# Patient Record
Sex: Male | Born: 1965 | Race: White | Hispanic: No | Marital: Married | Smoking: Never smoker
Health system: Southern US, Community
[De-identification: ages and names within clinical notes are randomized; demographics above are authoritative.]

## PROBLEM LIST (undated history)

## (undated) DIAGNOSIS — N200 Calculus of kidney: Secondary | ICD-10-CM

## (undated) DIAGNOSIS — I10 Essential (primary) hypertension: Secondary | ICD-10-CM

## (undated) HISTORY — PX: FINGER SURGERY: SHX640

---

## 2004-12-18 ENCOUNTER — Emergency Department: Payer: Self-pay | Admitting: Emergency Medicine

## 2005-09-17 ENCOUNTER — Emergency Department: Payer: Self-pay | Admitting: Emergency Medicine

## 2005-09-19 ENCOUNTER — Emergency Department: Payer: Self-pay | Admitting: Emergency Medicine

## 2005-09-21 ENCOUNTER — Emergency Department: Payer: Self-pay | Admitting: Unknown Physician Specialty

## 2005-09-23 ENCOUNTER — Emergency Department: Payer: Self-pay | Admitting: General Practice

## 2006-08-10 ENCOUNTER — Emergency Department: Payer: Self-pay | Admitting: Emergency Medicine

## 2009-04-22 ENCOUNTER — Emergency Department: Payer: Self-pay | Admitting: Emergency Medicine

## 2010-12-07 ENCOUNTER — Emergency Department: Payer: Self-pay | Admitting: Emergency Medicine

## 2011-03-07 ENCOUNTER — Emergency Department: Payer: Self-pay | Admitting: *Deleted

## 2011-04-30 ENCOUNTER — Emergency Department: Payer: Self-pay | Admitting: Unknown Physician Specialty

## 2011-05-02 ENCOUNTER — Emergency Department: Payer: Self-pay | Admitting: Emergency Medicine

## 2011-11-09 ENCOUNTER — Emergency Department: Payer: Self-pay | Admitting: Emergency Medicine

## 2011-11-09 LAB — CBC
HGB: 16 g/dL (ref 13.0–18.0)
MCH: 31.3 pg (ref 26.0–34.0)
MCHC: 33.3 g/dL (ref 32.0–36.0)
Platelet: 270 10*3/uL (ref 150–440)
WBC: 17.1 10*3/uL — ABNORMAL HIGH (ref 3.8–10.6)

## 2011-11-09 LAB — URINALYSIS, COMPLETE
Blood: NEGATIVE
Nitrite: NEGATIVE
Ph: 6 (ref 4.5–8.0)
Protein: 30
RBC,UR: 1 /HPF (ref 0–5)
Specific Gravity: 1.025 (ref 1.003–1.030)
Squamous Epithelial: 1

## 2011-11-09 LAB — BASIC METABOLIC PANEL
Anion Gap: 9 (ref 7–16)
Calcium, Total: 9.1 mg/dL (ref 8.5–10.1)
Chloride: 102 mmol/L (ref 98–107)
Co2: 27 mmol/L (ref 21–32)
Glucose: 136 mg/dL — ABNORMAL HIGH (ref 65–99)
Osmolality: 279 (ref 275–301)
Potassium: 3.4 mmol/L — ABNORMAL LOW (ref 3.5–5.1)

## 2011-11-11 LAB — URINE CULTURE

## 2012-02-03 ENCOUNTER — Emergency Department: Payer: Self-pay | Admitting: Emergency Medicine

## 2012-06-19 ENCOUNTER — Inpatient Hospital Stay: Payer: Self-pay | Admitting: Urology

## 2012-06-19 ENCOUNTER — Ambulatory Visit: Payer: Self-pay

## 2012-06-19 LAB — CBC WITH DIFFERENTIAL/PLATELET
Eosinophil #: 0 10*3/uL (ref 0.0–0.7)
Eosinophil %: 0.1 %
HCT: 42.2 % (ref 40.0–52.0)
HGB: 14.7 g/dL (ref 13.0–18.0)
Lymphocyte #: 1.2 10*3/uL (ref 1.0–3.6)
Lymphocyte %: 8 %
MCHC: 34.9 g/dL (ref 32.0–36.0)
Monocyte #: 0.8 x10 3/mm (ref 0.2–1.0)
Monocyte %: 4.9 %
Platelet: 232 10*3/uL (ref 150–440)
RBC: 4.56 10*6/uL (ref 4.40–5.90)
RDW: 13.2 % (ref 11.5–14.5)

## 2012-06-19 LAB — CBC
HCT: 45.4 % (ref 40.0–52.0)
HGB: 15 g/dL (ref 13.0–18.0)
MCH: 30.9 pg (ref 26.0–34.0)
MCV: 94 fL (ref 80–100)
Platelet: 272 10*3/uL (ref 150–440)
RBC: 4.84 10*6/uL (ref 4.40–5.90)
RDW: 13.2 % (ref 11.5–14.5)
WBC: 22.3 10*3/uL — ABNORMAL HIGH (ref 3.8–10.6)

## 2012-06-19 LAB — URINALYSIS, COMPLETE
Bacteria: NONE SEEN
Bilirubin,UR: NEGATIVE
RBC,UR: 48 /HPF (ref 0–5)
Specific Gravity: 1.031 (ref 1.003–1.030)
Squamous Epithelial: NONE SEEN
WBC UR: 11 /HPF (ref 0–5)

## 2012-06-19 LAB — BASIC METABOLIC PANEL
Anion Gap: 10 (ref 7–16)
BUN: 18 mg/dL (ref 7–18)
Calcium, Total: 8.7 mg/dL (ref 8.5–10.1)
Chloride: 107 mmol/L (ref 98–107)
Creatinine: 1.18 mg/dL (ref 0.60–1.30)
EGFR (African American): >60
Sodium: 139 mmol/L (ref 136–145)

## 2012-06-20 LAB — CBC WITH DIFFERENTIAL/PLATELET
Basophil %: 0.3 %
HCT: 39.8 % — ABNORMAL LOW (ref 40.0–52.0)
HGB: 13.5 g/dL (ref 13.0–18.0)
Lymphocyte #: 2.8 10*3/uL (ref 1.0–3.6)
MCH: 32.1 pg (ref 26.0–34.0)
Monocyte %: 11.1 %
Neutrophil #: 11 10*3/uL — ABNORMAL HIGH (ref 1.4–6.5)
Platelet: 206 10*3/uL (ref 150–440)

## 2012-06-20 LAB — BASIC METABOLIC PANEL
Anion Gap: 6 — ABNORMAL LOW (ref 7–16)
BUN: 18 mg/dL (ref 7–18)
Calcium, Total: 8.2 mg/dL — ABNORMAL LOW (ref 8.5–10.1)
Chloride: 108 mmol/L — ABNORMAL HIGH (ref 98–107)
Co2: 27 mmol/L (ref 21–32)
EGFR (African American): >60
EGFR (Non-African Amer.): 57 — ABNORMAL LOW
Sodium: 141 mmol/L (ref 136–145)

## 2012-06-21 LAB — BASIC METABOLIC PANEL
Anion Gap: 7 (ref 7–16)
Chloride: 108 mmol/L — ABNORMAL HIGH (ref 98–107)
Co2: 26 mmol/L (ref 21–32)
EGFR (African American): >60
Glucose: 89 mg/dL (ref 65–99)
Potassium: 3.9 mmol/L (ref 3.5–5.1)
Sodium: 141 mmol/L (ref 136–145)

## 2012-06-21 LAB — CBC WITH DIFFERENTIAL/PLATELET
Basophil #: 0 10*3/uL (ref 0.0–0.1)
Eosinophil #: 0.1 10*3/uL (ref 0.0–0.7)
HGB: 13.5 g/dL (ref 13.0–18.0)
Lymphocyte #: 3.3 10*3/uL (ref 1.0–3.6)
MCH: 32.3 pg (ref 26.0–34.0)
MCHC: 34.2 g/dL (ref 32.0–36.0)
MCV: 94 fL (ref 80–100)
Monocyte %: 9.9 %
Neutrophil #: 5.7 10*3/uL (ref 1.4–6.5)
Neutrophil %: 56.7 %

## 2012-06-21 LAB — PROTIME-INR: Prothrombin Time: 14 secs (ref 11.5–14.7)

## 2013-01-11 ENCOUNTER — Emergency Department: Payer: Self-pay | Admitting: Emergency Medicine

## 2013-01-11 LAB — CBC
HCT: 45.3 % (ref 40.0–52.0)
HGB: 15.6 g/dL (ref 13.0–18.0)
MCH: 31.7 pg (ref 26.0–34.0)
MCHC: 34.5 g/dL (ref 32.0–36.0)
MCV: 92 fL (ref 80–100)
Platelet: 281 10*3/uL (ref 150–440)
RBC: 4.93 10*6/uL (ref 4.40–5.90)
RDW: 13.2 % (ref 11.5–14.5)
WBC: 11.1 10*3/uL — ABNORMAL HIGH (ref 3.8–10.6)

## 2013-01-11 LAB — BASIC METABOLIC PANEL WITH GFR
Anion Gap: 7 (ref 7–16)
BUN: 15 mg/dL (ref 7–18)
Calcium, Total: 9.2 mg/dL (ref 8.5–10.1)
Chloride: 106 mmol/L (ref 98–107)
Co2: 27 mmol/L (ref 21–32)
Creatinine: 1.11 mg/dL (ref 0.60–1.30)
EGFR (African American): >60
EGFR (Non-African Amer.): >60
Glucose: 101 mg/dL — ABNORMAL HIGH (ref 65–99)
Osmolality: 280 (ref 275–301)
Potassium: 3.5 mmol/L (ref 3.5–5.1)
Sodium: 140 mmol/L (ref 136–145)

## 2013-01-11 LAB — URINALYSIS, COMPLETE
Bilirubin,UR: NEGATIVE
Glucose,UR: NEGATIVE mg/dL (ref 0–75)
Ketone: NEGATIVE
Leukocyte Esterase: NEGATIVE
Nitrite: NEGATIVE
Ph: 6 (ref 4.5–8.0)
RBC,UR: 17 /HPF (ref 0–5)
Specific Gravity: 1.018 (ref 1.003–1.030)
Squamous Epithelial: <1

## 2013-04-08 ENCOUNTER — Emergency Department: Payer: Self-pay | Admitting: Internal Medicine

## 2014-09-14 NOTE — H&P (Signed)
PATIENT NAME:  Matthew Bartlett, Matthew Bartlett MR#:  [id number] DATE OF BIRTH:  [birth year]  DATE OF ADMISSION:  06/19/2012  PRIMARY CARE PHYSICIAN:  None.   REFERRING PHYSICIAN:  Chiquita Loth, MD  CHIEF COMPLAINT: Left colic pain.   HISTORY OF PRESENT ILLNESS: This is a 49 year old male with significant past medical history of kidney stones that resolved without any intervention in 2012 without any significant past medical history besides that.  He presents with complaints of left renal colic. The patient reports over the last few hours he started to have left-sided renal colic, as well as was  accompanied by nausea and vomiting, multiple episodes. Denies any coffee-ground emesis, any chest pain, any shortness of breath, any bright red blood per rectum. The patient reports this pain resembles previous colic pain he had last year. In the ED the patient had CT abdomen without contrast which did show evidence of 5 mm kidney stones in the distal ureter causing left kidney hydronephrosis. The patient had multiple Dilaudid dosing with meperidine and Zofran and Demerol and Phenergan without resolution of his nausea and vomiting and with partial relief of his pain, so hospitalist service was requested to admit the patient for further management and control of the patient's symptoms.   PAST MEDICAL HISTORY:  History of kidney stones last year.   PAST SURGICAL HISTORY: None.   ALLERGIES:  PENICILLIN.  HOME MEDICATIONS: None.   SOCIAL HISTORY: The patient denies any history of smoking, alcohol or illicit or IV drug abuse, only reports smoking marijuana occasionally.   FAMILY HISTORY: He denies any family history of diabetes, coronary artery disease or kidney stones.   REVIEW OF SYSTEMS: CONSTITUTIONAL: Denies any fever, chills. Complains of weakness and fatigue.  EYES: Denies blurry vision, double vision, inflammation or glaucoma.  ENT: Denies tinnitus, ear pain, hearing loss, epistaxis or discharge.  RESPIRATORY:  Denies cough, wheezing, hemoptysis, COPD or dyspnea.  CARDIOVASCULAR: Denies chest pain, orthopnea, edema, palpitations, syncope.  GASTROINTESTINAL: Has complaints of nausea, vomiting, left flank pain. Denies any hematemesis, melena, jaundice, rectal bleed.  GENITOURINARY: Denies dysuria, hematuria, has complaints of left-sided renal colic for few hours.  ENDOCRINE:  Denies polyuria, polydipsia, heat or cold intolerance.  HEMATOLOGY: Denies anemia, easy bruising, bleeding diathesis.  INTEGUMENTARY: Denies acne, rash or skin lesions.  MUSCULOSKELETAL: Denies any neck pain, shoulder pain, knee pain, arthritis, gout or cramps.  NEUROLOGIC: Denies any CVA, TIA, seizures, dysarthria, epilepsy, tremors or vertigo.  PSYCHIATRIC: Denies anxiety, insomnia, nervousness, bipolar disorder.   PHYSICAL EXAMINATION:  VITAL SIGNS: Temperature 98.2, pulse 45, respiratory rate 18, blood pressure 185/89 and saturating 99% on room air.  GENERAL: Morbidly obese male, who is in bed in no apparent distress.  HEENT: Head normocephalic. Pupils equal, reactive to light. Pink conjunctivae. Anicteric sclerae. Moist oral mucosa.  NECK: Supple. No thyromegaly. No JVD.  CHEST: Good air entry bilaterally. No wheezing, rales, rhonchi.  CARDIOVASCULAR: S1, S2 heard. No rubs, murmur or gallops.  ABDOMEN:  Left abdominal tenderness. No rebound, no guarding. Bowel sounds present.  EXTREMITIES: No edema. No clubbing. No cyanosis.  PSYCHIATRIC: Appropriate affect. Awake, alert x 3. Intact judgment and insight.  NEUROLOGIC: Cranial nerves grossly intact. Motor 5 out of 5. No focal deficits.  SKIN: Normal skin turgor. Warm and dry. He had vital signs in the beginning and off.   LABORATORY AND DIAGNOSTIC DATA:  Glucose 143, BUN 18, creatinine 1.18, sodium 139, potassium 3.8, CO2 22, chloride 107.  White blood cells 22.3, hemoglobin 15, hematocrit 45, platelets  272. Urinalysis showing 48 red blood cells, 11 blood cells and leukocyte  esterase and nitrites are negative.   ASSESSMENT AND PLAN: 1.  Left-sided renal colic with left kidney hydronephrosis. This is due to renal stone in the distal ureter. Given the fact the patient has intractable nausea and vomiting and uncontrolled pain, he will be admitted for further administration of as needed pain and nausea medication. Will be kept on gentle hydration.  ED already discussed with urology on call, Dr.  Amie CritchleyErin McNamara who will evaluate the patient in the a.m.  The patient will be started on Flomax as per urology request as well.  Given the fact that the kidney stone is in the distal ureter, hopefully it will passed soon. 2.  Leukocytosis.  This is most likely related to patient's nausea and vomiting.  3.  Elevated blood pressure. The patient denies any history of hypertension, this is most likely related to his pain. Will add p.r.n. hydralazine as needed.  4.  Deep vein thrombosis prophylaxis with subcutaneous heparin.  Gastrointestinal prophylaxis with proton pump inhibitor.   CODE STATUS: Full code.   Total time spent on admission and patient care: 45 minutes.    ____________________________ Starleen Armsawood S. Armonee Bojanowski, MD dse:ct D: 06/19/2012 04:54:18 ET T: 06/19/2012 07:45:20 ET JOB#: [id number]  cc: Starleen Armsawood S. Tabathia Knoche, MD, <Dictator> Carloyn Lahue Teena IraniS Emile Kyllo MD ELECTRONICALLY SIGNED 06/20/2012 0:28

## 2014-09-14 NOTE — Consult Note (Signed)
Chief Complaint:   Subjective/Chief Complaint pain better though requiring parenteral analgesia   VITAL SIGNS/ANCILLARY NOTES: **Vital Signs.:   27-Jan-14 04:38   Vital Signs Type Routine   Temperature Temperature (F) 98.4   Celsius 36.8   Temperature Source Oral   Pulse Pulse 56   Respirations Respirations 20   Systolic BP Systolic BP 921   Diastolic BP (mmHg) Diastolic BP (mmHg) 57   Mean BP 78   Pulse Ox % Pulse Ox % 93   Pulse Ox Activity Level  At rest   Oxygen Delivery Room Air/ 21 %   Brief Assessment:   Gastrointestinal details normal Soft  Nontender   Lab Results: Routine Chem:  27-Jan-14 03:18    Glucose, Serum  105   BUN 18   Creatinine (comp)  1.45   Sodium, Serum 141   Potassium, Serum 3.6   Chloride, Serum  108   CO2, Serum 27   Calcium (Total), Serum  8.2   Anion Gap  6   Osmolality (calc) 284   eGFR (African American) >60   eGFR (Non-African American)  57 (eGFR values <84m/min/1.73 m2 may be an indication of chronic kidney disease (CKD). Calculated eGFR is useful in patients with stable renal function. The eGFR calculation will not be reliable in acutely ill patients when serum creatinine is changing rapidly. It is not useful in  patients on dialysis. The eGFR calculation may not be applicable to patients at the low and high extremes of body sizes, pregnant women, and vegetarians.)  Routine Hem:  27-Jan-14 03:18    WBC (CBC)  15.7   RBC (CBC)  4.22   Hemoglobin (CBC) 13.5   Hematocrit (CBC)  39.8   Platelet Count (CBC) 206   MCV 94   MCH 32.1   MCHC 34.0   RDW 13.4   Neutrophil % 70.4   Lymphocyte % 18.1   Monocyte % 11.1   Eosinophil % 0.1   Basophil % 0.3   Neutrophil #  11.0   Lymphocyte # 2.8   Monocyte #  1.7   Eosinophil # 0.0   Basophil # 0.0 (Result(s) reported on 20 Jun 2012 at 03:52AM.)   Assessment/Plan:  Assessment/Plan:   Assessment renal colic/ureteral calculus- he wants more time to try and see if he can pass the  stone    Plan 1. Will change to PCA 2. Can transfer to my service if desired   Electronic Signatures: SAbbie Sons(MD)  (Signed 27-Jan-14 08:16)  Authored: Chief Complaint, VITAL SIGNS/ANCILLARY NOTES, Brief Assessment, Lab Results, Assessment/Plan   Last Updated: 27-Jan-14 08:16 by SAbbie Sons(MD)

## 2014-09-14 NOTE — Discharge Summary (Signed)
PATIENT NAME:  Matthew CurtisHOMAS, Lenus L MR#:  [id number] DATE OF BIRTH:  03/20/1966  DATE OF ADMISSION:  06/19/2012 DATE OF DISCHARGE:  06/22/2012  ADMISSION DIAGNOSES: 1. Left distal ureteral calculus.  2. Left renal colic.   HISTORY OF PRESENT ILLNESS: The patient is a 49 year old male who presented to the Emergency Department on 26th of January with left flank pain, nausea and vomiting. CT scan was remarkable for a 4 mm left distal ureteral calculus. His pain could not be controlled well enough for outpatient management and he was admitted by the hospitalist service. He was seen by Dr. Alyce PaganMcNamara who had recommended placement of a left ureteral stent, however, he desired a trial of passage in lieu of surgical intervention. On 27th of January, his pain had improved although he was still requiring parenteral analgesia. On 28th of January, he was continuing to have intermittent pain and had opted for ureteroscopic removal, on 29th of January; however, on the evening of 28th of January he passed the stone with resolution of his pain.   DISPOSITION: The patient is discharged to home. He does have bilateral renal calculi on CT and was given a prescription for Percocet 1 to 2 p.o. q. 4 to 6 hours p.r.n. pain. Followup office visit was recommended in our office in approximately 1 month. He was instructed to contact the office for recurrent renal colic.   CONDITION AT DISCHARGE: Good.   PROGNOSIS: Good. ____________________________ Verna CzechScott C. Lonna CobbStoioff, MD scs:sb D: 06/30/2012 08:04:18 ET     T: 06/30/2012 08:25:37 ET        JOB#: [id number] cc: Lorin PicketScott C. Lonna CobbStoioff, MD, <Dictator> Riki AltesSCOTT C Astella Desir MD ELECTRONICALLY SIGNED 07/01/2012 7:41

## 2014-09-14 NOTE — Consult Note (Signed)
Reason for consultation:  distal left ureteral stone"pain"49 yo male presented to ED on Saturday with left flank and left sided pain radiating to groin that worsened during the evening.  He reports having pain for the last 3-4 days but last night it got much worse and the patient had two episodes of emesis.  He reports subjective chills.  Denies diarrhea, hematuria, dysuria.  This morning he still has pain 7/10 and had one episode of emesis.  He has not urinated since last evening.   previous kidney stones that the patient has passed without surgical intervention, he is not followed by urologyfinger surgery on right handnonePenicillinHistory: No family history of kidney stones, no known family history of GU cancerHistory:  the patient denies tobacco and ETOH use; occasional marijaunaworks on cars  +pain+nausea and vomiting; -hematuria, dysuria          -chest pain, SOB, palpitations, dizziness          -muskuloskeletal pain          -blurry vision  Temperature (F) : 98.5 : 59 : 20 BP : 159 BP (mmHg) : 91 Ox % : 96 Ox Activity Level  : At restDelivery : Room Air/ 21 % NAD, WDWN malesuppledry oral mucosa, multiple tattoosRRRunlabored breathingsoft, ND, mild tenderness along left flank and left side, no rebound tendernesscircumcised phallus, b/l testicles normal with no masses or hydrocelesno LE edema 15 (from [id number] 11, RBC 48, negative nitrite and leuk esterase, 1+ketones personally reviewed these films and discussed with the patient  PROCEDURE: CT  - CT ABDOMEN /PELVIS WO (STONE)  - Jun 19 2012  1:38AM   Renal stone protocol CT of the abdomen and pelvis is at 3 mm slice thickness in the axial plane with comparison to contrast enhanced images from 02 May 2011. There is mild, left hydronephrosis and hydroureter with some stranding demonstrated on the left. There is a stone in the left ureter just proximal to the ureteropelvic junction which 4.1 mm on the axial images. Bilateral  nephrolithiasis is  There is a small, hiatal hernia. The lung bases demonstrate atelectasis and some lingular fibrosis or atelectasis. No or pericardial effusion is evident. Noncontrast images of the spleen, adrenal glands, pancreas, gallbladder, aorta and bowel to be unremarkable otherwise. A normal appearing appendix is No bladder calculi are evident. Small, fat filled, inguinal hernias are present. The abdominal wall is intact There is no adenopathy. The aorta shows no aneurysmal dilation.  Obstructing, distal left ureteral calculus with mild hydronephrosis hydroureter. Bilateral nephrolithiasis is demonstrated. Other findings as listed above.Site: 57  49 yo male with 4mm left distal ureteral stone admitted for dehydration, leukocytosis, pain/nausea.  Pain- Continue toradol, flomax, narcotics for pain managmentDehydration- Increase IVF 176ml/hrNausea- NPOLeukocytosis- improving is a good chance the patient could pass this stone on his own but because his pain and nausea are persistant we have discussed OR for stent placement.  We discussed the risks and benefits of stent placement with possible stone extraction.  The patient would like to ideally pass the stone on his own but his pain is severe and persistant.  We will schedule him for the OR this afternoon.  In the meantime, he will be NPO and we will continue pain management.  If he were to develop fever or other signs of infection, we would urgently go to OR for stent placement.  The patient is aware of this.   you for this consult      Electronic Signatures: Janit Bern (MD)  (  Signed on 26-Jan-14 10:32)  Authored  Last Updated: 26-Jan-14 10:32 by Janit BernMcNamara, Shylin Keizer R (MD)

## 2014-12-22 ENCOUNTER — Encounter: Payer: Self-pay | Admitting: Emergency Medicine

## 2014-12-22 ENCOUNTER — Emergency Department
Admission: EM | Admit: 2014-12-22 | Discharge: 2014-12-23 | Disposition: A | Discharge status: Home / Self Care | Payer: Self-pay | Attending: Emergency Medicine | Admitting: Emergency Medicine

## 2014-12-22 ENCOUNTER — Emergency Department: Payer: Self-pay

## 2014-12-22 DIAGNOSIS — S92401A Displaced unspecified fracture of right great toe, initial encounter for closed fracture: Secondary | ICD-10-CM

## 2014-12-22 DIAGNOSIS — W102XXA Fall (on)(from) incline, initial encounter: Secondary | ICD-10-CM | POA: Insufficient documentation

## 2014-12-22 DIAGNOSIS — Y998 Other external cause status: Secondary | ICD-10-CM | POA: Insufficient documentation

## 2014-12-22 DIAGNOSIS — Y9355 Activity, bike riding: Secondary | ICD-10-CM | POA: Insufficient documentation

## 2014-12-22 DIAGNOSIS — S92411A Displaced fracture of proximal phalanx of right great toe, initial encounter for closed fracture: Secondary | ICD-10-CM | POA: Insufficient documentation

## 2014-12-22 DIAGNOSIS — Z880 Allergy status to penicillin: Secondary | ICD-10-CM | POA: Insufficient documentation

## 2014-12-22 DIAGNOSIS — Y9289 Other specified places as the place of occurrence of the external cause: Secondary | ICD-10-CM | POA: Insufficient documentation

## 2014-12-22 HISTORY — DX: Calculus of kidney: N20.0
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
## 2014-12-23 MED ORDER — OXYCODONE-ACETAMINOPHEN 5-325 MG PO TABS: 1.0000 | ORAL_TABLET | Freq: Four times a day (QID) | ORAL | Status: DC | PRN

## 2014-12-23 MED ORDER — OXYCODONE-ACETAMINOPHEN 5-325 MG PO TABS
2.0000 | ORAL_TABLET | Freq: Once | ORAL | Status: AC
  Administered 2014-12-23: 2 via ORAL
  Filled 2014-12-23: qty 2

## 2014-12-23 MED ORDER — NAPROXEN 250 MG PO TABS: 250.0000 mg | ORAL_TABLET | Freq: Two times a day (BID) | ORAL | Status: DC

## 2014-12-23 NOTE — ED Notes (Signed)
Crutches not given.  Pt has own

## 2014-12-23 NOTE — Discharge Instructions (Signed)
Buddy Taping of Toes °We have taped your toes together to keep them from moving. This is called "buddy taping" since we used a part of your own body to keep the injured part still. We placed soft padding between your toes to keep them from rubbing against each other. Buddy taping will help with healing and to reduce pain. Keep your toes buddy taped together for as long as directed by your caregiver. °HOME CARE INSTRUCTIONS  °· Raise your injured area above the level of your heart while sitting or lying down. Prop it up with pillows. °· An ice pack used every twenty minutes, while awake, for the first one to two days may be helpful. Put ice in a plastic bag and put a towel between the bag and your skin. °· Watch for signs that the taping is too tight. These signs may be: °· Numbness of your taped toes. °· Coolness of your taped toes. °· Color change in the area beyond the tape. °· Increased pain. °· If you have any of these signs, loosen or rewrap the tape. If you need to loosen or rewrap the buddy tape, make sure you use the padding again. °SEEK IMMEDIATE MEDICAL CARE IF:  °· You have worse pain, swelling, inflammation (soreness), drainage or bleeding after you rewrap the tape. °· Any new problems occur. °MAKE SURE YOU:  °· Understand these instructions. °· Will watch your condition. °· Will get help right away if you are not doing well or get worse. °Document Released: 02/13/2004 Document Revised: 08/03/2011 Document Reviewed: 05/08/2008 °[employer]® Patient Information ©2015 [employer]. This information is not intended to replace advice given to you by your health care provider. Make sure you discuss any questions you have with your health care provider. ° °Toe Fracture °Your caregiver has diagnosed you as having a fractured toe. A toe fracture is a break in the bone of a toe. "Buddy taping" is a way of splinting your broken toe, by taping the broken toe to the toe next to it. This "buddy taping" will keep the  injured toe from moving beyond normal range of motion. Buddy taping also helps the toe heal in a more normal alignment. It may take 6 to 8 weeks for the toe injury to heal. °HOME CARE INSTRUCTIONS  °· Leave your toes taped together for as long as directed by your caregiver or until you see a doctor for a follow-up examination. You can change the tape after bathing. Always use a small piece of gauze or cotton between the toes when taping them together. This will help the skin stay dry and prevent infection. °· Apply ice to the injury for 15-20 minutes each hour while awake for the first 2 days. Put the ice in a plastic bag and place a towel between the bag of ice and your skin. °· After the first 2 days, apply heat to the injured area. Use heat for the next 2 to 3 days. Place a heating pad on the foot or soak the foot in warm water as directed by your caregiver. °· Keep your foot elevated as much as possible to lessen swelling. °· Wear sturdy, supportive shoes. The shoes should not pinch the toes or fit tightly against the toes. °· Your caregiver may prescribe a rigid shoe if your foot is very swollen. °· Your may be given crutches if the pain is too great and it hurts too much to walk. °· Only take over-the-counter or prescription medicines for pain, discomfort,   or fever as directed by your caregiver.  If your caregiver has given you a follow-up appointment, it is very important to keep that appointment. Not keeping the appointment could result in a chronic or permanent injury, pain, and disability. If there is any problem keeping the appointment, you must call back to this facility for assistance. SEEK MEDICAL CARE IF:   You have increased pain or swelling, not relieved with medications.  The pain does not get better after 1 week.  Your injured toe is cold when the others are warm. SEEK IMMEDIATE MEDICAL CARE IF:   The toe becomes cold, numb, or white.  The toe becomes hot (inflamed) and  red. Document Released: 05/08/2000 Document Revised: 08/03/2011 Document Reviewed: 12/26/2007 [facility] Patient Information 2015 [place], [place]. This information is not intended to replace advice given to you by your health care provider. Make sure you discuss any questions you have with your health care provider.  You were prescribed a medication that is potentially sedating. Do not drink alcohol, drive or participate in any other potentially dangerous activities while taking this medication as it may make you sleepy. Do not take this medication with any other sedating medications, either prescription or over-the-counter. If you were prescribed Percocet or Vicodin, do not take these with acetaminophen (Tylenol) as it is already contained within these medications.   Opioid pain medications (or "narcotics") can be habit forming.  Use it as little as possible to achieve adequate pain control.  Do not use or use it with extreme caution if you have a history of opiate abuse or dependence.  If you are on a pain contract with your primary care doctor or a pain specialist, be sure to let them know you were prescribed this medication today from the [facility].  This medication is intended for your use only - do not give any to anyone else and keep it in a secure place where nobody else, especially children and pets, have access to it.  It will also cause or worsen constipation, so you may want to consider taking an over-the-counter stool softener while you are taking this medication.
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
## 2015-08-27 ENCOUNTER — Emergency Department: Payer: Self-pay

## 2015-08-27 ENCOUNTER — Observation Stay
Admission: EM | Admit: 2015-08-27 | Discharge: 2015-08-28 | Disposition: A | Discharge status: Home / Self Care | Payer: Self-pay | Attending: Internal Medicine | Admitting: Internal Medicine

## 2015-08-27 ENCOUNTER — Encounter: Payer: Self-pay | Admitting: Emergency Medicine

## 2015-08-27 DIAGNOSIS — Z87442 Personal history of urinary calculi: Secondary | ICD-10-CM | POA: Insufficient documentation

## 2015-08-27 DIAGNOSIS — R001 Bradycardia, unspecified: Secondary | ICD-10-CM | POA: Insufficient documentation

## 2015-08-27 DIAGNOSIS — R079 Chest pain, unspecified: Secondary | ICD-10-CM | POA: Insufficient documentation

## 2015-08-27 DIAGNOSIS — K760 Fatty (change of) liver, not elsewhere classified: Secondary | ICD-10-CM | POA: Insufficient documentation

## 2015-08-27 DIAGNOSIS — R109 Unspecified abdominal pain: Secondary | ICD-10-CM

## 2015-08-27 DIAGNOSIS — R1031 Right lower quadrant pain: Secondary | ICD-10-CM | POA: Insufficient documentation

## 2015-08-27 DIAGNOSIS — Z79899 Other long term (current) drug therapy: Secondary | ICD-10-CM | POA: Insufficient documentation

## 2015-08-27 DIAGNOSIS — D72829 Elevated white blood cell count, unspecified: Secondary | ICD-10-CM | POA: Insufficient documentation

## 2015-08-27 DIAGNOSIS — R1110 Vomiting, unspecified: Secondary | ICD-10-CM | POA: Diagnosis present

## 2015-08-27 DIAGNOSIS — Z880 Allergy status to penicillin: Secondary | ICD-10-CM | POA: Insufficient documentation

## 2015-08-27 DIAGNOSIS — R112 Nausea with vomiting, unspecified: Secondary | ICD-10-CM | POA: Insufficient documentation

## 2015-08-27 DIAGNOSIS — N200 Calculus of kidney: Principal | ICD-10-CM | POA: Insufficient documentation

## 2015-08-27 DIAGNOSIS — R1011 Right upper quadrant pain: Secondary | ICD-10-CM | POA: Insufficient documentation

## 2015-08-27 LAB — BASIC METABOLIC PANEL
ANION GAP: 8 (ref 5–15)
BUN: 13 mg/dL (ref 6–20)
CALCIUM: 9.6 mg/dL (ref 8.9–10.3)
CO2: 25 mmol/L (ref 22–32)
Chloride: 104 mmol/L (ref 101–111)
Creatinine, Ser: 0.99 mg/dL (ref 0.61–1.24)
GFR calc non Af Amer: >60 mL/min (ref >60)
Glucose, Bld: 161 mg/dL — ABNORMAL HIGH (ref 65–99)
Potassium: 3.5 mmol/L (ref 3.5–5.1)
Sodium: 137 mmol/L (ref 135–145)

## 2015-08-27 LAB — URINALYSIS COMPLETE WITH MICROSCOPIC (ARMC ONLY)
BACTERIA UA: NONE SEEN
BILIRUBIN URINE: NEGATIVE
GLUCOSE, UA: NEGATIVE mg/dL
Hgb urine dipstick: NEGATIVE
Leukocytes, UA: NEGATIVE
Nitrite: NEGATIVE
PH: 8 (ref 5.0–8.0)
Protein, ur: 30 mg/dL — AB
SQUAMOUS EPITHELIAL / LPF: NONE SEEN
Specific Gravity, Urine: 1.018 (ref 1.005–1.030)

## 2015-08-27 LAB — HEPATIC FUNCTION PANEL
ALT: 49 U/L (ref 17–63)
AST: 39 U/L (ref 15–41)
Albumin: 4.5 g/dL (ref 3.5–5.0)
Alkaline Phosphatase: 71 U/L (ref 38–126)
BILIRUBIN DIRECT: 0.2 mg/dL (ref 0.1–0.5)
Indirect Bilirubin: 0.5 mg/dL (ref 0.3–0.9)
TOTAL PROTEIN: 8.6 g/dL — AB (ref 6.5–8.1)
Total Bilirubin: 0.7 mg/dL (ref 0.3–1.2)

## 2015-08-27 LAB — CBC
HEMATOCRIT: 47.4 % (ref 40.0–52.0)
HEMOGLOBIN: 16.3 g/dL (ref 13.0–18.0)
MCH: 31.1 pg (ref 26.0–34.0)
MCHC: 34.5 g/dL (ref 32.0–36.0)
MCV: 90.2 fL (ref 80.0–100.0)
Platelets: 330 10*3/uL (ref 150–440)
RBC: 5.25 MIL/uL (ref 4.40–5.90)
RDW: 13.3 % (ref 11.5–14.5)
WBC: 16.5 10*3/uL — ABNORMAL HIGH (ref 3.8–10.6)

## 2015-08-27 LAB — MAGNESIUM: Magnesium: 1.9 mg/dL (ref 1.7–2.4)

## 2015-08-27 LAB — LIPASE, BLOOD: LIPASE: 44 U/L (ref 11–51)

## 2015-08-27 LAB — TROPONIN I

## 2015-08-27 MED ORDER — MORPHINE SULFATE (PF) 4 MG/ML IV SOLN
4.0000 mg | Freq: Once | INTRAVENOUS | Status: AC
  Administered 2015-08-27: 4 mg via INTRAVENOUS

## 2015-08-27 MED ORDER — POTASSIUM CHLORIDE IN NACL 20-0.9 MEQ/L-% IV SOLN
INTRAVENOUS | Status: DC
  Administered 2015-08-27 – 2015-08-28 (×2): via INTRAVENOUS
  Filled 2015-08-27 (×3): qty 1000

## 2015-08-27 MED ORDER — ACETAMINOPHEN 325 MG PO TABS: 650.0000 mg | ORAL_TABLET | Freq: Four times a day (QID) | ORAL | Status: DC | PRN

## 2015-08-27 MED ORDER — ONDANSETRON HCL 4 MG/2ML IJ SOLN: 4.0000 mg | Freq: Four times a day (QID) | INTRAMUSCULAR | Status: DC | PRN

## 2015-08-27 MED ORDER — ZOLPIDEM TARTRATE 5 MG PO TABS: 5.0000 mg | ORAL_TABLET | Freq: Every evening | ORAL | Status: DC | PRN

## 2015-08-27 MED ORDER — ONDANSETRON HCL 4 MG PO TABS: 4.0000 mg | ORAL_TABLET | Freq: Four times a day (QID) | ORAL | Status: DC | PRN

## 2015-08-27 MED ORDER — HYDROCODONE-ACETAMINOPHEN 5-325 MG PO TABS
1.0000 | ORAL_TABLET | ORAL | Status: DC | PRN
  Administered 2015-08-28: 2 via ORAL
  Filled 2015-08-27: qty 2

## 2015-08-27 MED ORDER — MORPHINE SULFATE (PF) 4 MG/ML IV SOLN
4.0000 mg | INTRAVENOUS | Status: DC | PRN
  Administered 2015-08-28 (×2): 4 mg via INTRAVENOUS
  Filled 2015-08-27 (×2): qty 1

## 2015-08-27 MED ORDER — MORPHINE SULFATE (PF) 4 MG/ML IV SOLN
4.0000 mg | Freq: Once | INTRAVENOUS | Status: AC
  Administered 2015-08-27: 4 mg via INTRAVENOUS
  Filled 2015-08-27: qty 1

## 2015-08-27 MED ORDER — HEPARIN SODIUM (PORCINE) 5000 UNIT/ML IJ SOLN
5000.0000 [IU] | Freq: Three times a day (TID) | INTRAMUSCULAR | Status: DC
  Administered 2015-08-27 – 2015-08-28 (×2): 5000 [IU] via SUBCUTANEOUS
  Filled 2015-08-27 (×2): qty 1

## 2015-08-27 MED ORDER — SODIUM CHLORIDE 0.9 % IV BOLUS (SEPSIS)
1000.0000 mL | Freq: Once | INTRAVENOUS | Status: AC
Start: 2015-08-27 — End: 2015-08-27
  Administered 2015-08-27: 1000 mL via INTRAVENOUS

## 2015-08-27 MED ORDER — FENTANYL CITRATE (PF) 100 MCG/2ML IJ SOLN
50.0000 ug | Freq: Once | INTRAMUSCULAR | Status: AC
  Administered 2015-08-27: 50 ug via INTRAVENOUS
  Filled 2015-08-27: qty 2

## 2015-08-27 MED ORDER — ACETAMINOPHEN 650 MG RE SUPP: 650.0000 mg | Freq: Four times a day (QID) | RECTAL | Status: DC | PRN

## 2015-08-27 MED ORDER — ONDANSETRON HCL 4 MG/2ML IJ SOLN
4.0000 mg | Freq: Once | INTRAMUSCULAR | Status: AC
  Administered 2015-08-27: 4 mg via INTRAVENOUS
  Filled 2015-08-27: qty 2

## 2015-08-27 MED ORDER — ALBUTEROL SULFATE (2.5 MG/3ML) 0.083% IN NEBU: 2.5000 mg | INHALATION_SOLUTION | RESPIRATORY_TRACT | Status: DC | PRN

## 2015-08-27 NOTE — H&P (Addendum)
[facility] [place] [facility]   PATIENT NAME: Matthew Bartlett    MR#:  [id number]  DATE OF BIRTH:  1966-02-29  DATE OF ADMISSION:  08/27/2015  PRIMARY CARE PHYSICIAN: No primary care provider on file.   REQUESTING/REFERRING PHYSICIAN: Myrna Blazer, MD  CHIEF COMPLAINT:   Chief Complaint  Patient presents with  . Nausea  . Emesis  . Chest Pain  Right flank pain, nausea and vomiting since this morning.  HISTORY OF PRESENT ILLNESS:  Eisa Necaise  is a 50 y.o. male with a known history of Kidney stone. The patient presents to the ED with right flank pain which is sharp, cramp, intermittent and 10 out of 10 without radiation. The patient also complains of a multiple times of for nausea and vomiting but no diarrhea, no melena or bloody stool. Patient complains of cloudy urine this morning but denies any dysuria or hematuria. he denies any fever or chills. He was treated with pain medication without improvement in the ED. ED physician requested admission. The CAT scan of abdomen show nonobstructive renal stone.  PAST MEDICAL HISTORY:   Past Medical History  Diagnosis Date  . Kidney stones     PAST SURGICAL HISTORY:  History reviewed. No pertinent past surgical history. No surgery.  SOCIAL HISTORY:   Social History  Substance Use Topics  . Smoking status: Never Smoker   . Smokeless tobacco: Not on file  . Alcohol Use: No    FAMILY HISTORY:  No family history on file. He denies any family history.  DRUG ALLERGIES:   Allergies  Allergen Reactions  . Penicillins Hives, Nausea And Vomiting and Other (See Comments)    Has patient had a PCN reaction causing immediate rash, facial/tongue/throat swelling, SOB or lightheadedness with hypotension: No Has patient had a PCN reaction causing severe rash involving mucus membranes or skin necrosis: No Has patient had a PCN reaction that required hospitalization No Has patient had a PCN reaction  occurring within the last 10 years: Yes If all of the above answers are "NO", then may proceed with Cephalosporin use.    REVIEW OF SYSTEMS:  CONSTITUTIONAL: No fever, fatigue or weakness.  EYES: No blurred or double vision.  EARS, NOSE, AND THROAT: No tinnitus or ear pain.  RESPIRATORY: No cough, shortness of breath, wheezing or hemoptysis.  CARDIOVASCULAR: No chest pain, orthopnea, edema.  GASTROINTESTINAL: Has nausea, vomiting and right flank pain, no diarrhea or abdominal pain.  GENITOURINARY: No dysuria, hematuria.  ENDOCRINE: No polyuria, nocturia,  HEMATOLOGY: No anemia, easy bruising or bleeding SKIN: No rash or lesion. MUSCULOSKELETAL: No joint pain or arthritis.   NEUROLOGIC: No tingling, numbness, weakness.  PSYCHIATRY: No anxiety or depression.   MEDICATIONS AT HOME:   Prior to Admission medications   Medication Sig Start Date End Date Taking? Authorizing Provider  oxyCODONE-acetaminophen (ROXICET) 5-325 MG per tablet Take 1 tablet by mouth every 6 (six) hours as needed for severe pain. 12/23/14  Yes Sharman Cheek, MD      VITAL SIGNS:  Blood pressure 161/87, pulse 48, temperature 98.1 F (36.7 C), temperature source Oral, resp. rate 21, height  (1.626 m), weight 99.791 kg (220 lb), SpO2 97 %.  PHYSICAL EXAMINATION:  GENERAL:  50 y.o.-year-old patient lying in the bed with no acute distress.  EYES: Pupils equal, round, reactive to light and accommodation. No scleral icterus. Extraocular muscles intact.  HEENT: Head atraumatic, normocephalic. Oropharynx and nasopharynx clear.  NECK:  Supple, no jugular venous  distention. No thyroid enlargement, no tenderness.  LUNGS: Normal breath sounds bilaterally, no wheezing, rales,rhonchi or crepitation. No use of accessory muscles of respiration.  CARDIOVASCULAR: S1, S2 normal. No murmurs, rubs, or gallops.  ABDOMEN: Soft, nondistended. Bowel sounds present. No organomegaly or mass. Right flank and the right side abdomen  tenderness on palpation. EXTREMITIES: No pedal edema, cyanosis, or clubbing.  NEUROLOGIC: Cranial nerves II through XII are intact. Muscle strength 5/5 in all extremities. Sensation intact. Gait not checked.  PSYCHIATRIC: The patient is alert and oriented x 3.  SKIN: No obvious rash, lesion, or ulcer.   LABORATORY PANEL:   CBC  Recent Labs Lab 08/27/15 1503  WBC 16.5*  HGB 16.3  HCT 47.4  PLT 330   ------------------------------------------------------------------------------------------------------------------  Chemistries   Recent Labs Lab 08/27/15 1503  NA 137  K 3.5  CL 104  CO2 25  GLUCOSE 161*  BUN 13  CREATININE 0.99  CALCIUM 9.6  AST 39  ALT 49  ALKPHOS 71  BILITOT 0.7   ------------------------------------------------------------------------------------------------------------------  Cardiac Enzymes  Recent Labs Lab 08/27/15 1503  TROPONINI <0.03   ------------------------------------------------------------------------------------------------------------------  RADIOLOGY:  Dg Chest 2 View  08/27/2015  CLINICAL DATA:  Right side chest pain EXAM: CHEST  2 VIEW COMPARISON:  04/30/2011 FINDINGS: Cardiomediastinal silhouette is stable. No acute infiltrate or pleural effusion. No pulmonary edema. Bony thorax is unremarkable. IMPRESSION: No active cardiopulmonary disease. Electronically Signed   By: Natasha Mead M.D.   On: 08/27/2015 16:43   Ct Renal Stone Study  08/27/2015  CLINICAL DATA:  Right side chest pain. Right upper abdominal pain and cramping. Nausea and vomiting. Symptoms today. Initial encounter. EXAM: CT ABDOMEN AND PELVIS WITHOUT CONTRAST TECHNIQUE: Multidetector CT imaging of the abdomen and pelvis was performed following the standard protocol without IV contrast. COMPARISON:  CT abdomen and pelvis 01/11/2013. FINDINGS: The lung bases are clear. No pleural or pericardial effusion. Heart size is normal. The liver is diffusely low attenuating  consistent with fatty infiltration. No focal liver lesion is identified. The gallbladder, adrenal glands, spleen and pancreas appear normal. Two punctate nonobstructing right renal stones are identified. There are no left renal stones. No hydronephrosis, ureteral or urinary bladder stones are identified. Scattered colonic diverticula are noted without diverticulitis. The colon is otherwise unremarkable. The stomach, small bowel and appendix appear normal. There is no lymphadenopathy or fluid. Scattered aortoiliac atherosclerosis without aneurysm is identified. There is no focal bony abnormality. IMPRESSION: No acute abnormality or finding to explain the patient's symptoms. Fatty infiltration of the liver. Two punctate nonobstructing right renal stones. Atherosclerosis. Electronically Signed   By: Drusilla Kanner M.D.   On: 08/27/2015 15:47    EKG:   Orders placed or performed during the hospital encounter of 08/27/15  . EKG 12-Lead  . EKG 12-Lead  . ED EKG within 10 minutes  . ED EKG within 10 minutes    IMPRESSION AND PLAN:   Abdominal pain, nausea and vomiting, possible due to kidney stone Placed for observation. Pain control, Zofran when necessary and IV fluid support, Nothing by mouth except medications and sips of water.  CT abdomen showed Two punctate nonobstructing right renal stones.  Leukocytosis. Possible due to reaction, follow-up CBC.  All the records are reviewed and case discussed with ED provider. Management plans discussed with the patient, family and they are in agreement.  CODE STATUS: Full code  TOTAL TIME TAKING CARE OF THIS PATIENT: 45 minutes.    Shaune Pollack M.D on 08/27/2015 at 7:50 PM  Between 7am to 6pm - Pager - [phone]  After 6pm go to [email or web address] - password [email or web address] [facility]  [facility]  Office  [phone]  CC: Primary care physician; No primary care provider on file.

## 2015-08-27 NOTE — ED Notes (Signed)
Patient presents to the ED with right sided chest pain/cramping pain, right upper quadrant abdominal pain and cramping, nausea, vomiting.  Patient reports vomiting more than 20 times today.  Patient denies diarrhea.

## 2015-08-27 NOTE — ED Provider Notes (Signed)
[facility] Emergency Department Provider Note  ____________________________________________  Time seen: Approximately 315 PM  I have reviewed the triage vital signs and the nursing notes.   HISTORY  Chief Complaint Nausea; Emesis; and Chest Pain    HPI Matthew Bartlett is a 50 y.o. male with a history of kidney stones is presenting to the emergency department today with right flank pain which she describes as a sharp, 10 out of 10 pain which started suddenly at 5 AM this morning. He says the pain is been constant. It is radiating up into the right side of his chest as well as down the right side of his back. He denies any blood in his urine. He says he has had multiple kidney stones in the past which have felt similarly.  He says that he has also vomited about 20 times today.  Denies any diarrhea. Says the pain is been constant. Denies any shortness of breath.   Past Medical History  Diagnosis Date  . Kidney stones     There are no active problems to display for this patient.   History reviewed. No pertinent past surgical history.  Current Outpatient Rx  Name  Route  Sig  Dispense  Refill  . oxyCODONE-acetaminophen (ROXICET) 5-325 MG per tablet   Oral   Take 1 tablet by mouth every 6 (six) hours as needed for severe pain.   12 tablet   0     Allergies Penicillins  No family history on file.  Social History Social History  Substance Use Topics  . Smoking status: Never Smoker   . Smokeless tobacco: None  . Alcohol Use: No    Review of Systems Constitutional: No fever/chills Eyes: No visual changes. ENT: No sore throat. Cardiovascular: As above Respiratory: Denies shortness of breath. Gastrointestinal:   No diarrhea.  No constipation. Genitourinary: Negative for dysuria. Musculoskeletal: As above Skin: Negative for rash. Neurological: Negative for headaches, focal weakness or numbness.  10-point ROS otherwise  negative.  ____________________________________________   PHYSICAL EXAM:  VITAL SIGNS: ED Triage Vitals  Enc Vitals Group     BP 08/27/15 1450 195/108 mmHg     Pulse Rate 08/27/15 1450 50     Resp 08/27/15 1450 26     Temp 08/27/15 1450 98.1 F (36.7 C)     Temp Source 08/27/15 1450 Oral     SpO2 08/27/15 1450 100 %     Weight 08/27/15 1450 220 lb (99.791 kg)     Height 08/27/15 1450  (1.626 m)     Head Cir --      Peak Flow --      Pain Score 08/27/15 1451 10     Pain Loc --      Pain Edu? --      Excl. in GC? --     Constitutional: Alert and oriented.in no acute distress. Eyes: Conjunctivae are normal. PERRL. EOMI. Head: Atraumatic. Nose: No congestion/rhinnorhea. Mouth/Throat: Mucous membranes are moist.  Oropharynx non-erythematous. Neck: No stridor.   Cardiovascular: Normal rate, regular rhythm. Grossly normal heart sounds.  Good peripheral circulation. Respiratory: Normal respiratory effort.  No retractions. Lungs CTAB. Gastrointestinal: Soft With mild right lower quadrant tenderness to palpation. No distention. No CVA tenderness. Musculoskeletal: No lower extremity tenderness nor edema.  No joint effusions. Neurologic:  Normal speech and language. No gross focal neurologic deficits are appreciated. No gait instability. Skin:  Skin is warm, dry and intact. No rash noted. Psychiatric: Mood and affect are  normal. Speech and behavior are normal.  ____________________________________________   LABS (all labs ordered are listed, but only abnormal results are displayed)  Labs Reviewed  BASIC METABOLIC PANEL - Abnormal; Notable for the following:    Glucose, Bld 161 (*)    All other components within normal limits  CBC - Abnormal; Notable for the following:    WBC 16.5 (*)    All other components within normal limits  HEPATIC FUNCTION PANEL - Abnormal; Notable for the following:    Total Protein 8.6 (*)    All other components within normal limits   URINALYSIS COMPLETEWITH MICROSCOPIC ([facility] ONLY) - Abnormal; Notable for the following:    Color, Urine YELLOW (*)    APPearance CLOUDY (*)    Ketones, ur TRACE (*)    Protein, ur 30 (*)    All other components within normal limits  TROPONIN I  LIPASE, BLOOD   ____________________________________________  EKG  ED ECG REPORT I, Schaevitz,  Teena Iraniavid M, the attending physician, personally viewed and interpreted this ECG.   Date: 08/27/2015  EKG Time: 1450  Rate: 52  Rhythm: sinus bradycardia  Axis: Normal axis  Intervals:none  ST&T Change: No ST segment elevation or depression. No abnormal T-wave inversion.  ____________________________________________  RADIOLOGY   CT RENAL STONE STUDY (Final result) Result time: 08/27/15 15:47:33   Final result by Rad Results In Interface (08/27/15 15:47:33)   Narrative:   CLINICAL DATA: Right side chest pain. Right upper abdominal pain and cramping. Nausea and vomiting. Symptoms today. Initial encounter.  EXAM: CT ABDOMEN AND PELVIS WITHOUT CONTRAST  TECHNIQUE: Multidetector CT imaging of the abdomen and pelvis was performed following the standard protocol without IV contrast.  COMPARISON: CT abdomen and pelvis 01/11/2013.  FINDINGS: The lung bases are clear. No pleural or pericardial effusion. Heart size is normal.  The liver is diffusely low attenuating consistent with fatty infiltration. No focal liver lesion is identified. The gallbladder, adrenal glands, spleen and pancreas appear normal. Two punctate nonobstructing right renal stones are identified. There are no left renal stones. No hydronephrosis, ureteral or urinary bladder stones are identified.  Scattered colonic diverticula are noted without diverticulitis. The colon is otherwise unremarkable. The stomach, small bowel and appendix appear normal. There is no lymphadenopathy or fluid. Scattered aortoiliac atherosclerosis without aneurysm is identified.  There  is no focal bony abnormality.  IMPRESSION: No acute abnormality or finding to explain the patient's symptoms.  Fatty infiltration of the liver.  Two punctate nonobstructing right renal stones.  Atherosclerosis.   Electronically Signed By: Drusilla Kannerhomas Dalessio M.D. On: 08/27/2015 15:47    ____________________________________________   PROCEDURES   ____________________________________________   INITIAL IMPRESSION / ASSESSMENT AND PLAN / ED COURSE  Pertinent labs & imaging results that were available during my care of the patient were reviewed by me and considered in my medical decision making (see chart for details).  ----------------------------------------- 7:39 PM on 08/27/2015 -----------------------------------------  Patient, despite multiple rounds of pain meds and anti-Medicare the hospital is persistently vomiting with abdominal pain. He is still tender to the right lower quadrant. He is sent to take sips of fluids but has vomited afterwards. I discussed with him possible discharge to home and he says that he does not feel safe doing this because he feels that he would not be able to eat and drink and his pain is out of control. He says that he is still having spasming right lower quadrant abdominal pain which is radiating up into his chest. Doubt PE  as the vital signs do not show any hypertension tachycardia or low oxygen saturation. Feel that he likely has a viral etiology but with intractable nausea and vomiting and abdominal pain. Normal-appearing appendix on the CAT scan as well. Signed out to Dr. Imogene Burn. ____________________________________________   FINAL CLINICAL IMPRESSION(S) / ED DIAGNOSES  Final diagnoses:  Right sided abdominal pain  Intractable nausea vomiting and abdominal pain.    Myrna Blazer, MD 08/27/15 [id number]

## 2015-08-27 NOTE — Progress Notes (Signed)
Pt arrived on unit. VSS.  Vista Sawatzky E Hobbs   

## 2015-08-28 LAB — CBC
HEMATOCRIT: 43.1 % (ref 40.0–52.0)
Hemoglobin: 14.7 g/dL (ref 13.0–18.0)
MCH: 31.4 pg (ref 26.0–34.0)
MCHC: 34.2 g/dL (ref 32.0–36.0)
MCV: 91.8 fL (ref 80.0–100.0)
Platelets: 260 10*3/uL (ref 150–440)
RBC: 4.69 MIL/uL (ref 4.40–5.90)
RDW: 13.3 % (ref 11.5–14.5)
WBC: 15.2 10*3/uL — ABNORMAL HIGH (ref 3.8–10.6)

## 2015-08-28 LAB — BASIC METABOLIC PANEL
Anion gap: 5 (ref 5–15)
BUN: 15 mg/dL (ref 6–20)
CALCIUM: 8.8 mg/dL — AB (ref 8.9–10.3)
CO2: 25 mmol/L (ref 22–32)
Chloride: 108 mmol/L (ref 101–111)
Creatinine, Ser: 0.97 mg/dL (ref 0.61–1.24)
GFR calc Af Amer: >60 mL/min (ref >60)
GLUCOSE: 110 mg/dL — AB (ref 65–99)
Potassium: 3.4 mmol/L — ABNORMAL LOW (ref 3.5–5.1)
Sodium: 138 mmol/L (ref 135–145)

## 2015-08-28 MED ORDER — ONDANSETRON 4 MG PO TBDP: 4.0000 mg | ORAL_TABLET | Freq: Three times a day (TID) | ORAL | Status: DC | PRN

## 2015-08-28 MED ORDER — HYDROCODONE-ACETAMINOPHEN 5-325 MG PO TABS: 1.0000 | ORAL_TABLET | ORAL | Status: DC | PRN

## 2015-08-28 NOTE — Progress Notes (Signed)
Alert and oriented. Vital signs stable . No signs of acute distress.  No other issues noted at this time.Patient  left without his discharge instructions and prescriptions.

## 2015-08-28 NOTE — Progress Notes (Signed)
Received a call from [facility] for bradycardic Heart rate of 45. RN checked patient .Patient was asleep as RN walked in the room. patient Alert and oriented. No signs of acute distress. Patient requested pain medicine for muscle soreness due to previous work-out before admission. No other issues .

## 2015-08-28 NOTE — Discharge Summary (Signed)
[facility] [place] [facility]   PATIENT NAME: Matthew Bartlett    MR#:  [id number]  DATE OF BIRTH:  1965/12/02  DATE OF ADMISSION:  08/27/2015 ADMITTING PHYSICIAN: Shaune Pollack, MD  DATE OF DISCHARGE: 08/28/2015  PRIMARY CARE PHYSICIAN: No primary care provider on file.    ADMISSION DIAGNOSIS:  Right sided abdominal pain [R10.9] Right lower quadrant abdominal pain [R10.31] Intractable vomiting with nausea, vomiting of unspecified type [R11.10]  DISCHARGE DIAGNOSIS:  Principal Problem:   Vomiting    Renal stone.  SECONDARY DIAGNOSIS:   Past Medical History  Diagnosis Date  . Kidney stones     HOSPITAL COURSE:   * Renal stone- causing nausea and vomit   With IV fluids and pain meds- stable next morning, may be he passed the stone, as CT on admission did not show any obstruction    Tolerated diet, d/c home with advise to follow with urologist office.  DISCHARGE CONDITIONS:   Stable.  CONSULTS OBTAINED:     DRUG ALLERGIES:   Allergies  Allergen Reactions  . Penicillins Hives, Nausea And Vomiting and Other (See Comments)    Has patient had a PCN reaction causing immediate rash, facial/tongue/throat swelling, SOB or lightheadedness with hypotension: No Has patient had a PCN reaction causing severe rash involving mucus membranes or skin necrosis: No Has patient had a PCN reaction that required hospitalization No Has patient had a PCN reaction occurring within the last 10 years: Yes If all of the above answers are "NO", then may proceed with Cephalosporin use.    DISCHARGE MEDICATIONS:   Current Discharge Medication List    START taking these medications   Details  HYDROcodone-acetaminophen (NORCO/VICODIN) 5-325 MG tablet Take 1 tablet by mouth every 4 (four) hours as needed for moderate pain. Qty: 30 tablet, Refills: 0    ondansetron (ZOFRAN ODT) 4 MG disintegrating tablet Take 1 tablet (4 mg total) by mouth every 8 (eight) hours as  needed for nausea or vomiting. Qty: 20 tablet, Refills: 0      STOP taking these medications     oxyCODONE-acetaminophen (ROXICET) 5-325 MG per tablet          DISCHARGE INSTRUCTIONS:    Follow with urologist in 1-2 weeks.  If you experience worsening of your admission symptoms, develop shortness of breath, life threatening emergency, suicidal or homicidal thoughts you must seek medical attention immediately by calling 911 or calling your MD immediately  if symptoms less severe.  You Must read complete instructions/literature along with all the possible adverse reactions/side effects for all the Medicines you take and that have been prescribed to you. Take any new Medicines after you have completely understood and accept all the possible adverse reactions/side effects.   Please note  You were cared for by a hospitalist during your hospital stay. If you have any questions about your discharge medications or the care you received while you were in the hospital after you are discharged, you can call the unit and asked to speak with the hospitalist on call if the hospitalist that took care of you is not available. Once you are discharged, your primary care physician will handle any further medical issues. Please note that NO REFILLS for any discharge medications will be authorized once you are discharged, as it is imperative that you return to your primary care physician (or establish a relationship with a primary care physician if you do not have one) for your aftercare needs so that they can  reassess your need for medications and monitor your lab values.    Today   CHIEF COMPLAINT:   Chief Complaint  Patient presents with  . Nausea  . Emesis  . Chest Pain    HISTORY OF PRESENT ILLNESS:  Matthew Bartlett  is a 50 y.o. male with a known history of Kidney stone. The patient presents to the ED with right flank pain which is sharp, cramp, intermittent and 10 out of 10 without radiation.  The patient also complains of a multiple times of for nausea and vomiting but no diarrhea, no melena or bloody stool. Patient complains of cloudy urine this morning but denies any dysuria or hematuria. he denies any fever or chills. He was treated with pain medication without improvement in the ED. ED physician requested admission. The CAT scan of abdomen show nonobstructive renal stone.   VITAL SIGNS:  Blood pressure 108/50, pulse 53, temperature 98.5 F (36.9 C), temperature source Oral, resp. rate 19, height 5\' 4"  (1.626 m), weight 110.269 kg (243 lb 1.6 oz), SpO2 98 %.  I/O:   Intake/Output Summary (Last 24 hours) at 08/28/15 1327 Last data filed at 08/28/15 1100  Gross per 24 hour  Intake 1345.3 ml  Output    300 ml  Net 1045.3 ml    PHYSICAL EXAMINATION:  GENERAL:  50 y.o.-year-old patient lying in the bed with no acute distress.  EYES: Pupils equal, round, reactive to light and accommodation. No scleral icterus. Extraocular muscles intact.  HEENT: Head atraumatic, normocephalic. Oropharynx and nasopharynx clear.  NECK:  Supple, no jugular venous distention. No thyroid enlargement, no tenderness.  LUNGS: Normal breath sounds bilaterally, no wheezing, rales,rhonchi or crepitation. No use of accessory muscles of respiration.  CARDIOVASCULAR: S1, S2 normal. No murmurs, rubs, or gallops.  ABDOMEN: Soft, non-tender, non-distended. Bowel sounds present. No organomegaly or mass.  EXTREMITIES: No pedal edema, cyanosis, or clubbing.  NEUROLOGIC: Cranial nerves II through XII are intact. Muscle strength 5/5 in all extremities. Sensation intact. Gait not checked.  PSYCHIATRIC: The patient is alert and oriented x 3.  SKIN: No obvious rash, lesion, or ulcer.   DATA REVIEW:   CBC  Recent Labs Lab 08/28/15 0535  WBC 15.2*  HGB 14.7  HCT 43.1  PLT 260    Chemistries   Recent Labs Lab 08/27/15 1503 08/28/15 0535  NA 137 138  K 3.5 3.4*  CL 104 108  CO2 25 25  GLUCOSE 161*  110*  BUN 13 15  CREATININE 0.99 0.97  CALCIUM 9.6 8.8*  MG 1.9  --   AST 39  --   ALT 49  --   ALKPHOS 71  --   BILITOT 0.7  --     Cardiac Enzymes  Recent Labs Lab 08/27/15 1503  TROPONINI <0.03    Microbiology Results  Results for orders placed or performed in visit on 11/09/11  Urine culture     Status: None   Collection Time: 11/09/11 12:45 PM  Result Value Ref Range Status   Micro Text Report   Final       SOURCE: CC    COMMENT                   NO GROWTH IN 36 HOURS   ANTIBIOTIC  RADIOLOGY:  Dg Chest 2 View  08/27/2015  CLINICAL DATA:  Right side chest pain EXAM: CHEST  2 VIEW COMPARISON:  04/30/2011 FINDINGS: Cardiomediastinal silhouette is stable. No acute infiltrate or pleural effusion. No pulmonary edema. Bony thorax is unremarkable. IMPRESSION: No active cardiopulmonary disease. Electronically Signed   By: Natasha Mead M.D.   On: 08/27/2015 16:43   Ct Renal Stone Study  08/27/2015  CLINICAL DATA:  Right side chest pain. Right upper abdominal pain and cramping. Nausea and vomiting. Symptoms today. Initial encounter. EXAM: CT ABDOMEN AND PELVIS WITHOUT CONTRAST TECHNIQUE: Multidetector CT imaging of the abdomen and pelvis was performed following the standard protocol without IV contrast. COMPARISON:  CT abdomen and pelvis 01/11/2013. FINDINGS: The lung bases are clear. No pleural or pericardial effusion. Heart size is normal. The liver is diffusely low attenuating consistent with fatty infiltration. No focal liver lesion is identified. The gallbladder, adrenal glands, spleen and pancreas appear normal. Two punctate nonobstructing right renal stones are identified. There are no left renal stones. No hydronephrosis, ureteral or urinary bladder stones are identified. Scattered colonic diverticula are noted without diverticulitis. The colon is otherwise unremarkable. The stomach, small bowel and appendix appear normal.  There is no lymphadenopathy or fluid. Scattered aortoiliac atherosclerosis without aneurysm is identified. There is no focal bony abnormality. IMPRESSION: No acute abnormality or finding to explain the patient's symptoms. Fatty infiltration of the liver. Two punctate nonobstructing right renal stones. Atherosclerosis. Electronically Signed   By: Drusilla Kanner M.D.   On: 08/27/2015 15:47    EKG:   Orders placed or performed during the hospital encounter of 08/27/15  . EKG 12-Lead  . EKG 12-Lead  . ED EKG within 10 minutes  . ED EKG within 10 minutes      Management plans discussed with the patient, family and they are in agreement.  CODE STATUS: Full.    Code Status Orders        Start     Ordered   08/27/15 2104  Full code   Continuous     08/27/15 2103    Code Status History    Date Active Date Inactive Code Status Order ID Comments User Context   This patient has a current code status but no historical code status.      TOTAL TIME TAKING CARE OF THIS PATIENT: 35 minutes.    Altamese Dilling M.D on 08/28/2015 at 1:27 PM  Between 7am to 6pm - Pager - [phone]  After 6pm go to [email or web address] - Social research officer, government  [facility] [place] [facility]  Office  [phone]  CC: Primary care physician; No primary care provider on file.   Note: This dictation was prepared with Dragon dictation along with smaller phrase technology. Any transcriptional errors that result from this process are unintentional.

## 2015-08-29 ENCOUNTER — Emergency Department: Payer: Self-pay

## 2015-08-29 ENCOUNTER — Encounter: Payer: Self-pay | Admitting: Emergency Medicine

## 2015-08-29 ENCOUNTER — Observation Stay
Admission: EM | Admit: 2015-08-29 | Discharge: 2015-08-30 | Disposition: A | Discharge status: Home / Self Care | Payer: Self-pay | Attending: Internal Medicine | Admitting: Internal Medicine

## 2015-08-29 DIAGNOSIS — R1013 Epigastric pain: Secondary | ICD-10-CM

## 2015-08-29 DIAGNOSIS — I10 Essential (primary) hypertension: Secondary | ICD-10-CM

## 2015-08-29 DIAGNOSIS — R61 Generalized hyperhidrosis: Secondary | ICD-10-CM | POA: Insufficient documentation

## 2015-08-29 DIAGNOSIS — R079 Chest pain, unspecified: Secondary | ICD-10-CM | POA: Insufficient documentation

## 2015-08-29 DIAGNOSIS — K219 Gastro-esophageal reflux disease without esophagitis: Secondary | ICD-10-CM | POA: Insufficient documentation

## 2015-08-29 DIAGNOSIS — R1110 Vomiting, unspecified: Secondary | ICD-10-CM | POA: Diagnosis present

## 2015-08-29 DIAGNOSIS — I700 Atherosclerosis of aorta: Secondary | ICD-10-CM | POA: Insufficient documentation

## 2015-08-29 DIAGNOSIS — Z87442 Personal history of urinary calculi: Secondary | ICD-10-CM | POA: Insufficient documentation

## 2015-08-29 DIAGNOSIS — K760 Fatty (change of) liver, not elsewhere classified: Secondary | ICD-10-CM | POA: Insufficient documentation

## 2015-08-29 DIAGNOSIS — R51 Headache: Secondary | ICD-10-CM | POA: Insufficient documentation

## 2015-08-29 DIAGNOSIS — R52 Pain, unspecified: Secondary | ICD-10-CM

## 2015-08-29 DIAGNOSIS — Z8249 Family history of ischemic heart disease and other diseases of the circulatory system: Secondary | ICD-10-CM | POA: Insufficient documentation

## 2015-08-29 DIAGNOSIS — N200 Calculus of kidney: Secondary | ICD-10-CM | POA: Insufficient documentation

## 2015-08-29 DIAGNOSIS — Z79899 Other long term (current) drug therapy: Secondary | ICD-10-CM | POA: Insufficient documentation

## 2015-08-29 DIAGNOSIS — Z880 Allergy status to penicillin: Secondary | ICD-10-CM | POA: Insufficient documentation

## 2015-08-29 DIAGNOSIS — K859 Acute pancreatitis without necrosis or infection, unspecified: Principal | ICD-10-CM | POA: Insufficient documentation

## 2015-08-29 DIAGNOSIS — R112 Nausea with vomiting, unspecified: Secondary | ICD-10-CM | POA: Insufficient documentation

## 2015-08-29 LAB — CBC
HCT: 44.6 % (ref 40.0–52.0)
Hemoglobin: 15.2 g/dL (ref 13.0–18.0)
MCH: 31.4 pg (ref 26.0–34.0)
MCHC: 34.1 g/dL (ref 32.0–36.0)
MCV: 92 fL (ref 80.0–100.0)
PLATELETS: 272 10*3/uL (ref 150–440)
RBC: 4.85 MIL/uL (ref 4.40–5.90)
RDW: 13.3 % (ref 11.5–14.5)
WBC: 12.8 10*3/uL — ABNORMAL HIGH (ref 3.8–10.6)

## 2015-08-29 LAB — URINALYSIS COMPLETE WITH MICROSCOPIC (ARMC ONLY)
BILIRUBIN URINE: NEGATIVE
Bacteria, UA: NONE SEEN
Glucose, UA: NEGATIVE mg/dL
Hgb urine dipstick: NEGATIVE
KETONES UR: NEGATIVE mg/dL
Leukocytes, UA: NEGATIVE
Nitrite: NEGATIVE
PH: 6 (ref 5.0–8.0)
Protein, ur: NEGATIVE mg/dL
Specific Gravity, Urine: 1.049 — ABNORMAL HIGH (ref 1.005–1.030)
Squamous Epithelial / LPF: NONE SEEN

## 2015-08-29 LAB — COMPREHENSIVE METABOLIC PANEL
ALT: 51 U/L (ref 17–63)
ANION GAP: 7 (ref 5–15)
AST: 36 U/L (ref 15–41)
Albumin: 4.1 g/dL (ref 3.5–5.0)
Alkaline Phosphatase: 64 U/L (ref 38–126)
BUN: 18 mg/dL (ref 6–20)
CHLORIDE: 107 mmol/L (ref 101–111)
CO2: 24 mmol/L (ref 22–32)
CREATININE: 1 mg/dL (ref 0.61–1.24)
Calcium: 8.7 mg/dL — ABNORMAL LOW (ref 8.9–10.3)
Glucose, Bld: 137 mg/dL — ABNORMAL HIGH (ref 65–99)
Potassium: 3.3 mmol/L — ABNORMAL LOW (ref 3.5–5.1)
Sodium: 138 mmol/L (ref 135–145)
Total Bilirubin: 0.4 mg/dL (ref 0.3–1.2)
Total Protein: 7.7 g/dL (ref 6.5–8.1)

## 2015-08-29 LAB — LIPID PANEL
Cholesterol: 170 mg/dL (ref 0–200)
HDL: 34 mg/dL — AB (ref >40)
LDL Cholesterol: 109 mg/dL — ABNORMAL HIGH (ref 0–99)
TRIGLYCERIDES: 135 mg/dL (ref <150)
Total CHOL/HDL Ratio: 5 RATIO
VLDL: 27 mg/dL (ref 0–40)

## 2015-08-29 LAB — LIPASE, BLOOD: LIPASE: 121 U/L — AB (ref 11–51)

## 2015-08-29 LAB — TROPONIN I
Troponin I: <0.03 ng/mL (ref <0.031)
Troponin I: <0.03 ng/mL (ref <0.031)

## 2015-08-29 LAB — PROTIME-INR
INR: 1.05
Prothrombin Time: 13.9 seconds (ref 11.4–15.0)

## 2015-08-29 LAB — APTT: APTT: 26 s (ref 24–36)

## 2015-08-29 MED ORDER — HYDROMORPHONE HCL 1 MG/ML IJ SOLN
1.0000 mg | INTRAMUSCULAR | Status: AC
  Administered 2015-08-29: 1 mg via INTRAVENOUS
  Filled 2015-08-29: qty 1

## 2015-08-29 MED ORDER — SODIUM CHLORIDE 0.9 % IV BOLUS (SEPSIS)
1000.0000 mL | Freq: Once | INTRAVENOUS | Status: AC
  Administered 2015-08-29: 1000 mL via INTRAVENOUS
  Filled 2015-08-29: qty 1000

## 2015-08-29 MED ORDER — ONDANSETRON HCL 4 MG/2ML IJ SOLN
4.0000 mg | Freq: Once | INTRAMUSCULAR | Status: AC
  Administered 2015-08-29: 4 mg via INTRAVENOUS

## 2015-08-29 MED ORDER — IOPAMIDOL (ISOVUE-370) INJECTION 76%
100.0000 mL | Freq: Once | INTRAVENOUS | Status: AC | PRN
  Administered 2015-08-29: 100 mL via INTRAVENOUS

## 2015-08-29 MED ORDER — MAGNESIUM SULFATE 2 GM/50ML IV SOLN: 2.0000 g | Freq: Once | INTRAVENOUS | Status: DC

## 2015-08-29 MED ORDER — HYDROMORPHONE HCL 1 MG/ML IJ SOLN
INTRAMUSCULAR | Status: AC
  Filled 2015-08-29: qty 1

## 2015-08-29 MED ORDER — ONDANSETRON HCL 4 MG/2ML IJ SOLN
4.0000 mg | Freq: Once | INTRAMUSCULAR | Status: AC
  Administered 2015-08-29: 4 mg via INTRAVENOUS
  Filled 2015-08-29: qty 2

## 2015-08-29 MED ORDER — ONDANSETRON HCL 4 MG/2ML IJ SOLN
INTRAMUSCULAR | Status: AC
  Administered 2015-08-29: 4 mg
  Filled 2015-08-29: qty 2

## 2015-08-29 MED ORDER — BUTALBITAL-APAP-CAFFEINE 50-325-40 MG PO TABS
1.0000 | ORAL_TABLET | Freq: Four times a day (QID) | ORAL | Status: DC | PRN
  Administered 2015-08-29 – 2015-08-30 (×3): 1 via ORAL
  Filled 2015-08-29 (×3): qty 1

## 2015-08-29 MED ORDER — HYDROMORPHONE HCL 1 MG/ML IJ SOLN
1.0000 mg | Freq: Once | INTRAMUSCULAR | Status: DC
  Filled 2015-08-29: qty 1

## 2015-08-29 MED ORDER — HEPARIN SODIUM (PORCINE) 5000 UNIT/ML IJ SOLN
5000.0000 [IU] | Freq: Three times a day (TID) | INTRAMUSCULAR | Status: DC
  Administered 2015-08-29 – 2015-08-30 (×3): 5000 [IU] via SUBCUTANEOUS
  Filled 2015-08-29 (×3): qty 1

## 2015-08-29 MED ORDER — PANTOPRAZOLE SODIUM 40 MG IV SOLR
40.0000 mg | Freq: Two times a day (BID) | INTRAVENOUS | Status: DC
  Administered 2015-08-29 – 2015-08-30 (×3): 40 mg via INTRAVENOUS
  Filled 2015-08-29 (×3): qty 40

## 2015-08-29 MED ORDER — ONDANSETRON HCL 4 MG/2ML IJ SOLN
4.0000 mg | Freq: Four times a day (QID) | INTRAMUSCULAR | Status: DC | PRN
  Administered 2015-08-30: 4 mg via INTRAVENOUS
  Filled 2015-08-29: qty 2

## 2015-08-29 MED ORDER — SODIUM CHLORIDE 0.9% FLUSH: 3.0000 mL | Freq: Two times a day (BID) | INTRAVENOUS | Status: DC

## 2015-08-29 MED ORDER — CYCLOBENZAPRINE HCL 10 MG PO TABS
5.0000 mg | ORAL_TABLET | Freq: Three times a day (TID) | ORAL | Status: DC | PRN
  Administered 2015-08-29 – 2015-08-30 (×2): 5 mg via ORAL
  Filled 2015-08-29 (×2): qty 1

## 2015-08-29 MED ORDER — SODIUM CHLORIDE 0.9 % IV SOLN
INTRAVENOUS | Status: DC
  Administered 2015-08-29 – 2015-08-30 (×2): via INTRAVENOUS

## 2015-08-29 MED ORDER — ONDANSETRON HCL 4 MG/2ML IJ SOLN
INTRAMUSCULAR | Status: AC
  Administered 2015-08-29: 4 mg via INTRAVENOUS
  Filled 2015-08-29: qty 2

## 2015-08-29 MED ORDER — HYDROMORPHONE HCL 1 MG/ML IJ SOLN
1.0000 mg | INTRAMUSCULAR | Status: DC | PRN
  Administered 2015-08-29 – 2015-08-30 (×7): 1 mg via INTRAVENOUS
  Filled 2015-08-29 (×6): qty 1

## 2015-08-29 MED ORDER — NITROGLYCERIN 0.4 MG SL SUBL
0.4000 mg | SUBLINGUAL_TABLET | SUBLINGUAL | Status: DC | PRN
  Administered 2015-08-29: 0.4 mg via SUBLINGUAL
  Filled 2015-08-29: qty 1

## 2015-08-29 NOTE — ED Provider Notes (Signed)
[facility] Emergency Department Provider Note  ____________________________________________  Time seen: Approximately 7:53 AM  I have reviewed the triage vital signs and the nursing notes.   HISTORY  Chief Complaint Chest Pain and Nausea  EM caveat: The patient is a poor historian, he is unable to provide some portions of his history stating he is not clear doesn't recall  HPI Matthew Bartlett is a 50 y.o. male he called 911 today for chest painassociated with severe nausea and sweats.  Patient reports for 2 days he's been having pain in his upper abdomen, also in the lower chest. It got better some with pain medicine at the hospital, but he does not know the diagnosis. He then reports that this morning it came back and is having severe pain in the upper abdomen pointing over his epigastrium, with associated nausea and sweatiness. He reports he's been vomiting, and he felt like since of developing a headache after is been throwing up several times.  He denies fever or chills, he denies any other recent illnesses aside from this starting about 2 days ago.   Past Medical History  Diagnosis Date  . Kidney stones     Patient Active Problem List   Diagnosis Date Noted  . Vomiting 08/27/2015    History reviewed. No pertinent past surgical history.  Current Outpatient Rx  Name  Route  Sig  Dispense  Refill  . HYDROcodone-acetaminophen (NORCO/VICODIN) 5-325 MG tablet   Oral   Take 1 tablet by mouth every 4 (four) hours as needed for moderate pain.   30 tablet   0   . ondansetron (ZOFRAN ODT) 4 MG disintegrating tablet   Oral   Take 1 tablet (4 mg total) by mouth every 8 (eight) hours as needed for nausea or vomiting.   20 tablet   0     Allergies Penicillins  No family history on file.  Social History Social History  Substance Use Topics  . Smoking status: Never Smoker   . Smokeless tobacco: None  . Alcohol Use: No    Review of  Systems Constitutional: No fever/chills Eyes: No visual changes. ENT: No sore throat. Cardiovascular: History of present illness  Respiratory: Denies shortness of breath. Gastrointestinal:  No diarrhea.  No constipation. Genitourinary: Negative for dysuria. Musculoskeletal: Negative for back pain. Skin: Negative for rash. Neurological: Negative for headaches, focal weakness or numbness.    10-point ROS otherwise negative.  ____________________________________________   PHYSICAL EXAM:  VITAL SIGNS: ED Triage Vitals  Enc Vitals Group     BP --      Pulse --      Resp --      Temp --      Temp src --      SpO2 --      Weight --      Height --      Head Cir --      Peak Flow --      Pain Score --      Pain Loc --      Pain Edu? --      Excl. in GC? --    Constitutional: Alert and oriented. Pale, diaphoretic and appearing in pain holding emesis basin. He appears to be quite ill appearing Eyes: Conjunctivae are normal. PERRL. EOMI. Head: Atraumatic. Nose: No congestion/rhinnorhea. Mouth/Throat: Mucous membranes are moist.  Oropharynx non-erythematous. Neck: No stridor.  No meningismus Cardiovascular: Bradycardic rate, regular rhythm. Grossly normal heart sounds.  Good peripheral circulation.  Respiratory: Normal respiratory effort.  No retractions. Lungs CTAB. Gastrointestinal: Soft and nontender. Negative Murphy. No distention.  Musculoskeletal: No lower extremity tenderness nor edema.  No joint effusions. Neurologic:  Normal speech and language. No gross focal neurologic deficits are appreciated. Skin:  Skin is warm and diaphoretic. No rash noted. Psychiatric: Mood and affect are anxious. Speech is normal. ____________________________________________   LABS (all labs ordered are listed, but only abnormal results are displayed)  Labs Reviewed  CBC - Abnormal; Notable for the following:    WBC 12.8 (*)    All other components within normal limits  COMPREHENSIVE  METABOLIC PANEL - Abnormal; Notable for the following:    Potassium 3.3 (*)    Glucose, Bld 137 (*)    Calcium 8.7 (*)    All other components within normal limits  LIPASE, BLOOD - Abnormal; Notable for the following:    Lipase 121 (*)    All other components within normal limits  TROPONIN I  PROTIME-INR  APTT  URINALYSIS COMPLETEWITH MICROSCOPIC ([facility] ONLY)   ____________________________________________  EKG  ED ECG REPORT I, QUALE, MARK, the attending physician, personally viewed and interpreted this ECG.  Date: 08/29/2015 EKG Time: 747 Rate: 45 Rhythm: Sinus bradycardia  QRS Axis: normal Intervals: normal ST/T Wave abnormalities: normal Conduction Disturbances: none Narrative Interpretation: Sinus bradycardia, no ischemic abnormality ____________________________________________  RADIOLOGY  CT Head Wo Contrast (Final result) Result time: 08/29/15 08:39:51   Final result by Rad Results In Interface (08/29/15 08:39:51)   Narrative:   CLINICAL DATA: Headaches  EXAM: CT HEAD WITHOUT CONTRAST  TECHNIQUE: Contiguous axial images were obtained from the base of the skull through the vertex without intravenous contrast.  COMPARISON: None.  FINDINGS: Bony calvarium is intact. Opacification of the right maxillary antrum is noted likely related to chronic sinusitis. No findings to suggest acute hemorrhage, acute infarction or space-occupying mass lesion are noted.  IMPRESSION: No acute abnormality seen.   Electronically Signed By: Alcide CleverMark Lukens M.D. On: 08/29/2015 08:39          CT ANGIO CHEST AORTA W/CM &/OR WO/CM (Final result) Result time: 08/29/15 08:47:12   Final result by Rad Results In Interface (08/29/15 08:47:12)   Narrative:   CLINICAL DATA: Nausea, vomiting, chest pain, and headache  EXAM: CT ANGIOGRAPHY CHEST, ABDOMEN AND PELVIS  TECHNIQUE: Multidetector CT imaging through the chest, abdomen and pelvis was performed using the  standard protocol during bolus administration of intravenous contrast. Multiplanar reconstructed images and MIPs were obtained and reviewed to evaluate the vascular anatomy.  CONTRAST: 100 cc Isovue 370 intravenous  COMPARISON: Abdominal CT 2 days ago  FINDINGS: CTA CHEST FINDINGS  THORACIC INLET/BODY WALL:  No acute abnormality.  MEDIASTINUM:  Normal heart size. No pericardial effusion. Normal caliber aorta with no dissection or intramural hematoma. Mild atherosclerotic calcification of the arch level. Suspected 2 small areas of atherosclerotic calcification on the LAD. No pulmonary artery filling defect is seen.  LUNG WINDOWS:  There is no edema, consolidation, effusion, or pneumothorax.  OSSEOUS:  No acute fracture. No suspicious lytic or blastic lesions.  Review of the MIP images confirms the above findings.  CTA ABDOMEN AND PELVIS FINDINGS  Abdominal wall: Fatty enlargement of the inguinal canals without definitive hernia.  Hepatobiliary: Hepatic steatosis without focal lesion.No evidence of biliary obstruction or stone.  Pancreas: Unremarkable.  Spleen: Unremarkable.  Adrenals/Urinary Tract: Negative adrenals. Tiny cyst exophytic from the lower pole left kidney. Right nephrolithiasis noted on prior CT not visible on this enhanced scan. No  hydronephrosis. Unremarkable bladder.  Reproductive:No pathologic findings.  Stomach/Bowel: No obstruction. Mild colonic diverticulosis. No appendicitis.  Vascular/Lymphatic: The left hepatic artery is replaced to the left gastric. Otherwise, aortic branching is standard. There is atheromatous thickening and calcification on the aortic wall without aneurysm or dissection. No visceral artery stenosis or occlusion. No mass or adenopathy.  Peritoneal: No ascites or pneumoperitoneum.  Musculoskeletal: No acute abnormalities.  Review of the MIP images confirms the above findings.  IMPRESSION: 1. No evidence of  acute aortic syndrome or other cause of symptoms. 2. Atherosclerosis. 3. Hepatic steatosis.   Electronically Signed By: Marnee Spring M.D. On: 08/29/2015 08:47          CT CTA Abd/Pel w/cm &/or w/o cm (Final result) Result time: 08/29/15 08:47:12   Final result by Rad Results In Interface (08/29/15 08:47:12)   Narrative:   CLINICAL DATA: Nausea, vomiting, chest pain, and headache  EXAM: CT ANGIOGRAPHY CHEST, ABDOMEN AND PELVIS  TECHNIQUE: Multidetector CT imaging through the chest, abdomen and pelvis was performed using the standard protocol during bolus administration of intravenous contrast. Multiplanar reconstructed images and MIPs were obtained and reviewed to evaluate the vascular anatomy.  CONTRAST: 100 cc Isovue 370 intravenous  COMPARISON: Abdominal CT 2 days ago  FINDINGS: CTA CHEST FINDINGS  THORACIC INLET/BODY WALL:  No acute abnormality.  MEDIASTINUM:  Normal heart size. No pericardial effusion. Normal caliber aorta with no dissection or intramural hematoma. Mild atherosclerotic calcification of the arch level. Suspected 2 small areas of atherosclerotic calcification on the LAD. No pulmonary artery filling defect is seen.  LUNG WINDOWS:  There is no edema, consolidation, effusion, or pneumothorax.  OSSEOUS:  No acute fracture. No suspicious lytic or blastic lesions.  Review of the MIP images confirms the above findings.  CTA ABDOMEN AND PELVIS FINDINGS  Abdominal wall: Fatty enlargement of the inguinal canals without definitive hernia.  Hepatobiliary: Hepatic steatosis without focal lesion.No evidence of biliary obstruction or stone.  Pancreas: Unremarkable.  Spleen: Unremarkable.  Adrenals/Urinary Tract: Negative adrenals. Tiny cyst exophytic from the lower pole left kidney. Right nephrolithiasis noted on prior CT not visible on this enhanced scan. No hydronephrosis. Unremarkable bladder.  Reproductive:No pathologic  findings.  Stomach/Bowel: No obstruction. Mild colonic diverticulosis. No appendicitis.  Vascular/Lymphatic: The left hepatic artery is replaced to the left gastric. Otherwise, aortic branching is standard. There is atheromatous thickening and calcification on the aortic wall without aneurysm or dissection. No visceral artery stenosis or occlusion. No mass or adenopathy.  Peritoneal: No ascites or pneumoperitoneum.  Musculoskeletal: No acute abnormalities.  Review of the MIP images confirms the above findings.  IMPRESSION: 1. No evidence of acute aortic syndrome or other cause of symptoms. 2. Atherosclerosis. 3. Hepatic steatosis.   Electronically Signed By: Marnee Spring M.D. On: 08/29/2015 08:47    ____________________________________________   PROCEDURES  Procedure(s) performed: None  Critical Care performed: No  ____________________________________________   INITIAL IMPRESSION / ASSESSMENT AND PLAN / ED COURSE  Pertinent labs & imaging results that were available during my care of the patient were reviewed by me and considered in my medical decision making (see chart for details).  Differential diagnosis includes but is not limited to, abdominal perforation, aortic dissection, cholecystitis, appendicitis, diverticulitis, colitis, esophagitis/gastritis, kidney stone, pyelonephritis, urinary tract infection, aortic aneurysm. All are considered in decision and treatment plan. Based upon the patient's presentation and risk factors, I am concerned the patient requires rule out of acute aortic dissection given his elevated blood pressure, presentation, pain which is reported that  does not seem to be worsened by exam concerning for possible vascular etiology. He had a CT which did not demonstrate any stone recently, and was admitted however reports his symptoms are ongoing. His initial EKG does not demonstrate clear ischemic abnormality but is bradycardic. He is  diaphoretic, pale appearing, and appears in pain. He is a concerning presentation, certainly also consider acute cardiac etiologies, chest etiologies such as dissection, aneurysm, etc.  We'll obtain CT of the head to evaluate for intracranial hemorrhage though it does appear that his chest pain nausea and vomiting started previous to what he describes as a mild headache. No signs or symptoms suggest meningitis.  ----------------------------------------- 10:02 AM on 08/29/2015 -----------------------------------------  Patient reporting some improvement, continues to have intractable nausea, moderate pain epigastrically. He overall does appear improved, however continues to have ongoing discomfort. Given this re-presentation of intractable pain, discussed with Dr. Scot Dock on the the hospitalist service and we'll readmit him for ongoing evaluation and further workup/pain control.  ____________________________________________   FINAL CLINICAL IMPRESSION(S) / ED DIAGNOSES  Final diagnoses:  Chest pain, unspecified chest pain type  Intractable pain  Intractable vomiting with nausea, vomiting of unspecified type      Sharyn Creamer, MD 08/29/15 1002

## 2015-08-29 NOTE — ED Notes (Signed)
Pt c/o headache off and on this am, states the dilaudid helps initially, then quickly wears off, Dr Fanny BienQuale notified.

## 2015-08-29 NOTE — H&P (Signed)
[facility] [place] [facility]   PATIENT NAME: Matthew Bartlett    MR#:  [id number]  DATE OF BIRTH:  [birth year]  DATE OF ADMISSION:  08/29/2015  PRIMARY CARE PHYSICIAN: No primary care provider on file.   REQUESTING/REFERRING PHYSICIAN: Quale  CHIEF COMPLAINT:   Chief Complaint  Patient presents with  . Chest Pain  . Nausea    HISTORY OF PRESENT ILLNESS: Matthew Bartlett  is a 50 y.o. male with a known history of Renal stone, nonobstructing, was admitted 2 days ago with severe epigastric and abdominal pain with intractable vomiting. But on CT scan of the abdomen was found nonobstructing renal stone and he was feeling completely fine yesterday morning and tolerated regular food so I discharged him home. As per him in the evening he was doing fine when he went to bed, this morning again he woke up with the similar thing severe epigastric pain which was going to his back and intractable vomiting so he came back to emergency room. In ER CT angiogram of abdomen and chest were done which did not show any dissections or major abnormalities, troponin is negative, lipase is 121. Patient required multiple Dilaudid injections and Zofran to help him control and so he is given his admission to hospitalist team. On further questioning patient denies any chronic use of pain medications, aspirin or BC powders.  PAST MEDICAL HISTORY:   Past Medical History  Diagnosis Date  . Kidney stones     PAST SURGICAL HISTORY: History reviewed. No pertinent past surgical history.  SOCIAL HISTORY:  Social History  Substance Use Topics  . Smoking status: Never Smoker   . Smokeless tobacco: Not on file  . Alcohol Use: No    FAMILY HISTORY:  Family History  Problem Relation Age of Onset  . Hypertension Father     DRUG ALLERGIES:  Allergies  Allergen Reactions  . Penicillins Hives, Nausea And Vomiting and Other (See Comments)    Has patient had a PCN reaction causing immediate rash,  facial/tongue/throat swelling, SOB or lightheadedness with hypotension: No Has patient had a PCN reaction causing severe rash involving mucus membranes or skin necrosis: No Has patient had a PCN reaction that required hospitalization No Has patient had a PCN reaction occurring within the last 10 years: Yes If all of the above answers are "NO", then may proceed with Cephalosporin use.    REVIEW OF SYSTEMS:   CONSTITUTIONAL: No fever, fatigue or weakness.  EYES: No blurred or double vision.  EARS, NOSE, AND THROAT: No tinnitus or ear pain.  RESPIRATORY: No cough, shortness of breath, wheezing or hemoptysis.  CARDIOVASCULAR: No chest pain, orthopnea, edema.  GASTROINTESTINAL: positive nausea, vomiting,no diarrhea , severe epigastric and abdominal pain.  GENITOURINARY: No dysuria, hematuria.  ENDOCRINE: No polyuria, nocturia,  HEMATOLOGY: No anemia, easy bruising or bleeding SKIN: No rash or lesion. MUSCULOSKELETAL: No joint pain or arthritis.   NEUROLOGIC: No tingling, numbness, weakness.  PSYCHIATRY: No anxiety or depression.   MEDICATIONS AT HOME:  Prior to Admission medications   Medication Sig Start Date End Date Taking? Authorizing Provider  HYDROcodone-acetaminophen (NORCO/VICODIN) 5-325 MG tablet Take 1 tablet by mouth every 4 (four) hours as needed for moderate pain. 08/28/15  Yes Altamese Dilling, MD  ondansetron (ZOFRAN ODT) 4 MG disintegrating tablet Take 1 tablet (4 mg total) by mouth every 8 (eight) hours as needed for nausea or vomiting. 08/28/15  Yes Altamese Dilling, MD      PHYSICAL EXAMINATION:   VITAL  SIGNS: Blood pressure 141/77, pulse 44, temperature 98.4 F (36.9 C), temperature source Oral, resp. rate 13, height 5\' 4"  (1.626 m), weight 108.863 kg (240 lb), SpO2 98 %.  GENERAL:  51 y.o.-year-old patient sitting in the bed ,diaphoretic.  EYES: Pupils equal, round, reactive to light and accommodation. No scleral icterus. Extraocular muscles intact.  HEENT:  Head atraumatic, normocephalic. Oropharynx and nasopharynx clear.  NECK:  Supple, no jugular venous distention. No thyroid enlargement, no tenderness.  LUNGS: Normal breath sounds bilaterally, no wheezing, rales,rhonchi or crepitation. No use of accessory muscles of respiration.  CARDIOVASCULAR: S1, S2 normal. No murmurs, rubs, or gallops.  ABDOMEN: Soft, mild tender, nondistended. Bowel sounds present. No organomegaly or mass.  EXTREMITIES: No pedal edema, cyanosis, or clubbing.  NEUROLOGIC: Cranial nerves II through XII are intact. Muscle strength 5/5 in all extremities. Sensation intact. Gait not checked.  PSYCHIATRIC: The patient is alert and oriented x 3.  SKIN: No obvious rash, lesion, or ulcer.   LABORATORY PANEL:   CBC  Recent Labs Lab 08/27/15 1503 08/28/15 0535 08/29/15 0750  WBC 16.5* 15.2* 12.8*  HGB 16.3 14.7 15.2  HCT 47.4 43.1 44.6  PLT 330 260 272  MCV 90.2 91.8 92.0  MCH 31.1 31.4 31.4  MCHC 34.5 34.2 34.1  RDW 13.3 13.3 13.3   ------------------------------------------------------------------------------------------------------------------  Chemistries   Recent Labs Lab 08/27/15 1503 08/28/15 0535 08/29/15 0750  NA 137 138 138  K 3.5 3.4* 3.3*  CL 104 108 107  CO2 25 25 24   GLUCOSE 161* 110* 137*  BUN 13 15 18   CREATININE 0.99 0.97 1.00  CALCIUM 9.6 8.8* 8.7*  MG 1.9  --   --   AST 39  --  36  ALT 49  --  51  ALKPHOS 71  --  64  BILITOT 0.7  --  0.4   ------------------------------------------------------------------------------------------------------------------ estimated creatinine clearance is 97.8 mL/min (by C-G formula based on Cr of 1). ------------------------------------------------------------------------------------------------------------------ No results for input(s): TSH, T4TOTAL, T3FREE, THYROIDAB in the last 72 hours.  Invalid input(s): FREET3   Coagulation profile  Recent Labs Lab 08/29/15 0750  INR 1.05    ------------------------------------------------------------------------------------------------------------------- No results for input(s): DDIMER in the last 72 hours. -------------------------------------------------------------------------------------------------------------------  Cardiac Enzymes  Recent Labs Lab 08/27/15 1503 08/29/15 0750  TROPONINI <0.03 <0.03   ------------------------------------------------------------------------------------------------------------------ Invalid input(s): POCBNP  ---------------------------------------------------------------------------------------------------------------  Urinalysis    Component Value Date/Time   COLORURINE YELLOW* 08/27/2015 1709   COLORURINE Yellow 01/11/2013 2011   APPEARANCEUR CLOUDY* 08/27/2015 1709   APPEARANCEUR Clear 01/11/2013 2011   LABSPEC 1.018 08/27/2015 1709   LABSPEC 1.018 01/11/2013 2011   PHURINE 8.0 08/27/2015 1709   PHURINE 6.0 01/11/2013 2011   GLUCOSEU NEGATIVE 08/27/2015 1709   GLUCOSEU Negative 01/11/2013 2011   HGBUR NEGATIVE 08/27/2015 1709   HGBUR 1+ 01/11/2013 2011   BILIRUBINUR NEGATIVE 08/27/2015 1709   BILIRUBINUR Negative 01/11/2013 2011   KETONESUR TRACE* 08/27/2015 1709   KETONESUR Negative 01/11/2013 2011   PROTEINUR 30* 08/27/2015 1709   PROTEINUR Negative 01/11/2013 2011   NITRITE NEGATIVE 08/27/2015 1709   NITRITE Negative 01/11/2013 2011   LEUKOCYTESUR NEGATIVE 08/27/2015 1709   LEUKOCYTESUR Negative 01/11/2013 2011     RADIOLOGY: Dg Chest 2 View  08/27/2015  CLINICAL DATA:  Right side chest pain EXAM: CHEST  2 VIEW COMPARISON:  04/30/2011 FINDINGS: Cardiomediastinal silhouette is stable. No acute infiltrate or pleural effusion. No pulmonary edema. Bony thorax is unremarkable. IMPRESSION: No active cardiopulmonary disease. Electronically Signed   By: Lang Snow  Pop M.D.   On: 08/27/2015 16:43   Ct Head Wo Contrast  08/29/2015  CLINICAL DATA:  Headaches EXAM: CT HEAD  WITHOUT CONTRAST TECHNIQUE: Contiguous axial images were obtained from the base of the skull through the vertex without intravenous contrast. COMPARISON:  None. FINDINGS: Bony calvarium is intact. Opacification of the right maxillary antrum is noted likely related to chronic sinusitis. No findings to suggest acute hemorrhage, acute infarction or space-occupying mass lesion are noted. IMPRESSION: No acute abnormality seen. Electronically Signed   By: Alcide Clever M.D.   On: 08/29/2015 08:39   Ct Angio Chest Aorta W/cm &/or Wo/cm  08/29/2015  CLINICAL DATA:  Nausea, vomiting, chest pain, and headache EXAM: CT ANGIOGRAPHY CHEST, ABDOMEN AND PELVIS TECHNIQUE: Multidetector CT imaging through the chest, abdomen and pelvis was performed using the standard protocol during bolus administration of intravenous contrast. Multiplanar reconstructed images and MIPs were obtained and reviewed to evaluate the vascular anatomy. CONTRAST:  100 cc Isovue 370 intravenous COMPARISON:  Abdominal CT 2 days ago FINDINGS: CTA CHEST FINDINGS THORACIC INLET/BODY WALL: No acute abnormality. MEDIASTINUM: Normal heart size. No pericardial effusion. Normal caliber aorta with no dissection or intramural hematoma. Mild atherosclerotic calcification of the arch level. Suspected 2 small areas of atherosclerotic calcification on the LAD. No pulmonary artery filling defect is seen. LUNG WINDOWS: There is no edema, consolidation, effusion, or pneumothorax. OSSEOUS: No acute fracture.  No suspicious lytic or blastic lesions. Review of the MIP images confirms the above findings. CTA ABDOMEN AND PELVIS FINDINGS Abdominal wall: Fatty enlargement of the inguinal canals without definitive hernia. Hepatobiliary: Hepatic steatosis without focal lesion.No evidence of biliary obstruction or stone. Pancreas: Unremarkable. Spleen: Unremarkable. Adrenals/Urinary Tract: Negative adrenals. Tiny cyst exophytic from the lower pole left kidney. Right nephrolithiasis  noted on prior CT not visible on this enhanced scan. No hydronephrosis. Unremarkable bladder. Reproductive:No pathologic findings. Stomach/Bowel: No obstruction. Mild colonic diverticulosis. No appendicitis. Vascular/Lymphatic: The left hepatic artery is replaced to the left gastric. Otherwise, aortic branching is standard. There is atheromatous thickening and calcification on the aortic wall without aneurysm or dissection. No visceral artery stenosis or occlusion. No mass or adenopathy. Peritoneal: No ascites or pneumoperitoneum. Musculoskeletal: No acute abnormalities. Review of the MIP images confirms the above findings. IMPRESSION: 1. No evidence of acute aortic syndrome or other cause of symptoms. 2. Atherosclerosis. 3. Hepatic steatosis. Electronically Signed   By: Marnee Spring M.D.   On: 08/29/2015 08:47   Ct Renal Stone Study  08/27/2015  CLINICAL DATA:  Right side chest pain. Right upper abdominal pain and cramping. Nausea and vomiting. Symptoms today. Initial encounter. EXAM: CT ABDOMEN AND PELVIS WITHOUT CONTRAST TECHNIQUE: Multidetector CT imaging of the abdomen and pelvis was performed following the standard protocol without IV contrast. COMPARISON:  CT abdomen and pelvis 01/11/2013. FINDINGS: The lung bases are clear. No pleural or pericardial effusion. Heart size is normal. The liver is diffusely low attenuating consistent with fatty infiltration. No focal liver lesion is identified. The gallbladder, adrenal glands, spleen and pancreas appear normal. Two punctate nonobstructing right renal stones are identified. There are no left renal stones. No hydronephrosis, ureteral or urinary bladder stones are identified. Scattered colonic diverticula are noted without diverticulitis. The colon is otherwise unremarkable. The stomach, small bowel and appendix appear normal. There is no lymphadenopathy or fluid. Scattered aortoiliac atherosclerosis without aneurysm is identified. There is no focal bony  abnormality. IMPRESSION: No acute abnormality or finding to explain the patient's symptoms. Fatty infiltration of the liver. Two  punctate nonobstructing right renal stones. Atherosclerosis. Electronically Signed   By: Drusilla Kanner M.D.   On: 08/27/2015 15:47   Ct Cta Abd/pel W/cm &/or W/o Cm  08/29/2015  CLINICAL DATA:  Nausea, vomiting, chest pain, and headache EXAM: CT ANGIOGRAPHY CHEST, ABDOMEN AND PELVIS TECHNIQUE: Multidetector CT imaging through the chest, abdomen and pelvis was performed using the standard protocol during bolus administration of intravenous contrast. Multiplanar reconstructed images and MIPs were obtained and reviewed to evaluate the vascular anatomy. CONTRAST:  100 cc Isovue 370 intravenous COMPARISON:  Abdominal CT 2 days ago FINDINGS: CTA CHEST FINDINGS THORACIC INLET/BODY WALL: No acute abnormality. MEDIASTINUM: Normal heart size. No pericardial effusion. Normal caliber aorta with no dissection or intramural hematoma. Mild atherosclerotic calcification of the arch level. Suspected 2 small areas of atherosclerotic calcification on the LAD. No pulmonary artery filling defect is seen. LUNG WINDOWS: There is no edema, consolidation, effusion, or pneumothorax. OSSEOUS: No acute fracture.  No suspicious lytic or blastic lesions. Review of the MIP images confirms the above findings. CTA ABDOMEN AND PELVIS FINDINGS Abdominal wall: Fatty enlargement of the inguinal canals without definitive hernia. Hepatobiliary: Hepatic steatosis without focal lesion.No evidence of biliary obstruction or stone. Pancreas: Unremarkable. Spleen: Unremarkable. Adrenals/Urinary Tract: Negative adrenals. Tiny cyst exophytic from the lower pole left kidney. Right nephrolithiasis noted on prior CT not visible on this enhanced scan. No hydronephrosis. Unremarkable bladder. Reproductive:No pathologic findings. Stomach/Bowel: No obstruction. Mild colonic diverticulosis. No appendicitis. Vascular/Lymphatic: The left  hepatic artery is replaced to the left gastric. Otherwise, aortic branching is standard. There is atheromatous thickening and calcification on the aortic wall without aneurysm or dissection. No visceral artery stenosis or occlusion. No mass or adenopathy. Peritoneal: No ascites or pneumoperitoneum. Musculoskeletal: No acute abnormalities. Review of the MIP images confirms the above findings. IMPRESSION: 1. No evidence of acute aortic syndrome or other cause of symptoms. 2. Atherosclerosis. 3. Hepatic steatosis. Electronically Signed   By: Marnee Spring M.D.   On: 08/29/2015 08:47    EKG: Orders placed or performed during the hospital encounter of 08/29/15  . EKG 12-Lead  . EKG 12-Lead  . ED EKG  . ED EKG    IMPRESSION AND PLAN:  * Intractable nausea and vomiting   Epigastric and abdominal pain.      IV Dilaudid and Zofran for symptom relief.   CT scan of abdomen with angiogram does not show any significant findings, but the renal stone which was visible on the CT scan 2 days ago is now not there.   Maybe patient passed the stone and that's why he had this severe pain and vomiting.   His lipase is slightly elevated, I will check his lipid panel.    He also has some complain of burning epigastric pain frequently and some bloating sensation so I will call GI consult to help decide if this is from the peptic ulcer or something else.   Meanwhile will keep him on clear liquid diet and give him Protonix IV twice a day.   I will follow tomorrow set of troponin, first one was negative. Keep him on cardiac monitor.  * Uncontrolled hypertension   On presentation his initial blood pressure was more than 200   Likely due to intractable pain and vomiting.    After receiving Dilaudid injection it came under control.   We'll continue monitoring.    All the records are reviewed and case discussed with ED provider. Management plans discussed with the patient, family and they are  in  agreement.  CODE STATUS: Code Status History    Date Active Date Inactive Code Status Order ID Comments User Context   08/27/2015  9:03 PM 08/28/2015  4:55 PM Full Code [id number]  Shaune PollackQing Chen, MD Inpatient       TOTAL TIME TAKING CARE OF THIS PATIENT: 45 minutes.  Patient's wife was present in the room during my visit.  Altamese DillingVACHHANI, Isela Stantz M.D on 08/29/2015   Between 7am to 6pm - Pager - [phone]  After 6pm go to [email or web address] - Social research officer, governmentpassword [email or web address] [email or web address]  [facility]  Office  [phone]  CC: Primary care physician; No primary care provider on file.   Note: This dictation was prepared with Dragon dictation along with smaller phrase technology. Any transcriptional errors that result from this process are unintentional.

## 2015-08-29 NOTE — ED Notes (Signed)
Pt had no pain relief with one SL ntg. Dilauded given with a decrease in pain level. Pt being taken for CT scan, will readdress pain level upon room arrival.

## 2015-08-29 NOTE — ED Notes (Signed)
Pt here via EMS from home with c/o recurrent cp over the past few days, describes it as sharp epigastric pain that "comes out of nowhere." Pt diaphoretic upon assessment, "gagging" without vomiting. Pt states he was seen here yesterday and then left. Denies sob. Pt does have gallbladder, bp 220/88.

## 2015-08-29 NOTE — Consult Note (Signed)
GI Inpatient Consult Note  Reason for Consult: Intractable nausea and vomiting   Attending Requesting Consult: Dr. Elisabeth Pigeon  History of Present Illness: Matthew Bartlett is a 50 y.o. male with a history kidney stones admitted for abdominal pain with intractable nausea and vomiting.  Review of provider notes indicate patient presented to the [facility] ED on 08/27/15 for upper abdominal, R flank, and lower chest pain.  Associated symptoms include nausea , vomiting "20 times", and diaphoresis.  CT renal stone study showed 2 small, nonobstructive stones in the R kidney.  CXR was negative.  He was admitted to rule out cardiac etiologies, as the kidney stones were not likely causing his pain.  Patient reported good improvement the following morning with IV pain medicine, so he was discharged.  Patient returned to the ED later that afternoon (4/5) complaining of continued upper abdominal and lower chest pain.  Nausea, vomiting and diaphoresis persisted as well.  CTA abd and chest was negative.  Lipase was elevated at 121.  Patient required multiple doses of Dilaudid and Zofran w/o improvement, so admission was called.  Today, Matthew Bartlett continues to report upper abdominal and lower chest pain.  He is a poor historian when discussing chronicity of problems and associated symptoms.  It has subsided a bit since admission, but he still feels somewhat nauseous and "just uncomfortable".  He has not vomited since admission.  Patient cannot identify any dietary triggers that brought on nausea or vomiting.  He occasionally has heartburn and esophageal reflux, but does not take acid reducers. He denies significant EtOH or NSAID use.  No history of cholesterol problems.  He has not started any new medication or supplements.  No weight loss or appetite changes (prior to nausea beginning).  Bowel habits are unchanged and w/o frank blood or melena.  Patient denies a FHx of CCA, colon polyps, or other GI malignancy.  Past Medical  History:  Past Medical History  Diagnosis Date  . Kidney stones     Problem List: Patient Active Problem List   Diagnosis Date Noted  . Epigastric pain 08/29/2015  . Uncontrolled hypertension 08/29/2015  . Vomiting 08/27/2015    Past Surgical History: History reviewed. No pertinent past surgical history.  Allergies: Allergies  Allergen Reactions  . Penicillins Hives, Nausea And Vomiting and Other (See Comments)    Has patient had a PCN reaction causing immediate rash, facial/tongue/throat swelling, SOB or lightheadedness with hypotension: No Has patient had a PCN reaction causing severe rash involving mucus membranes or skin necrosis: No Has patient had a PCN reaction that required hospitalization No Has patient had a PCN reaction occurring within the last 10 years: Yes If all of the above answers are "NO", then may proceed with Cephalosporin use.    Home Medications: Prescriptions prior to admission  Medication Sig Dispense Refill Last Dose  . HYDROcodone-acetaminophen (NORCO/VICODIN) 5-325 MG tablet Take 1 tablet by mouth every 4 (four) hours as needed for moderate pain. 30 tablet 0 PRN at PRN  . ondansetron (ZOFRAN ODT) 4 MG disintegrating tablet Take 1 tablet (4 mg total) by mouth every 8 (eight) hours as needed for nausea or vomiting. 20 tablet 0 PRN at PRN   Home medication reconciliation was completed with the patient.   Scheduled Inpatient Medications:   . heparin  5,000 Units Subcutaneous 3 times per day  . HYDROmorphone      .  HYDROmorphone (DILAUDID) injection  1 mg Intravenous Once  . magnesium sulfate 1 - 4  g bolus IVPB  2 g Intravenous Once  . pantoprazole (PROTONIX) IV  40 mg Intravenous Q12H  . sodium chloride flush  3 mL Intravenous Q12H    Continuous Inpatient Infusions:   . sodium chloride      PRN Inpatient Medications:  butalbital-acetaminophen-caffeine, cyclobenzaprine, HYDROmorphone (DILAUDID) injection, nitroGLYCERIN, ondansetron (ZOFRAN)  IV  Family History: family history includes Hypertension in his father.    Social History:   reports that he has never smoked. He does not have any smokeless tobacco history on file. He reports that he uses illicit drugs (Marijuana). He reports that he does not drink alcohol.   Review of Systems: Constitutional: Weight is stable.  Eyes: No changes in vision. ENT: No oral lesions, sore throat.  GI: see HPI.  Heme/Lymph: No easy bruising.  CV: No chest pain.  GU: No hematuria.  Integumentary: No rashes.  Neuro: No headaches.  Psych: No depression/anxiety.  Endocrine: No heat/cold intolerance.  Allergic/Immunologic: No urticaria.  Resp: No cough, SOB.  Musculoskeletal: No joint swelling.    Physical Examination: BP 178/85 mmHg  Pulse 47  Temp(Src) 98.5 F (36.9 C) (Oral)  Resp 18  Ht 5\' 4"  (1.626 m)  Wt 108.863 kg (240 lb)  BMI 41.18 kg/m2  SpO2 98% Gen: NAD, alert and oriented x 4 HEENT: PEERLA, EOMI, Neck: supple, no JVD or thyromegaly Chest: CTA bilaterally, no wheezes, crackles, or other adventitious sounds CV: RRR, no m/g/c/r Abd: soft, mild generalized tenderness to deep palpation, ND, +BS in all four quadrants; no HSM, guarding, ridigity, or rebound tenderness Ext: no edema, well perfused with 2+ pulses, Skin: no rash or lesions noted Lymph: no LAD  Data: Lab Results  Component Value Date   WBC 12.8* 08/29/2015   HGB 15.2 08/29/2015   HCT 44.6 08/29/2015   MCV 92.0 08/29/2015   PLT 272 08/29/2015    Recent Labs Lab 08/27/15 1503 08/28/15 0535 08/29/15 0750  HGB 16.3 14.7 15.2   Lab Results  Component Value Date   NA 138 08/29/2015   K 3.3* 08/29/2015   CL 107 08/29/2015   CO2 24 08/29/2015   BUN 18 08/29/2015   CREATININE 1.00 08/29/2015   Lab Results  Component Value Date   ALT 51 08/29/2015   AST 36 08/29/2015   ALKPHOS 64 08/29/2015   BILITOT 0.4 08/29/2015    Recent Labs Lab 08/29/15 0750  APTT 26  INR 1.05    Assessment/Plan: Matthew Bartlett is a 50 y.o. male with a history kidney stones admitted for abdominal pain with intractable nausea and vomiting.  Extensive evaluation to exclude acute cardiac or GI issues including CT renal stone study, CXR, and CTA abd and chest have all returned negative.  Labs were notable for lipase of 121, but patient denies significant EtOH or new meds/supplements.  Lipid panel returned WNL.  He currently reports modest improvement with conservative treatment.  The etiology of patient's discomfort is unclear.  Patient may have mild acute pancreatitis despite the lack of CT findings.  Therefore, conservative management with IV pain control and anti-emetics is recommended.  If he fails to improve, his upper abdominal/chest pain could certainly but due to PUD, gastritis, or H pylori; patient does endorse occasional heartburn and esophageal reflux.  However, this should improve with IV Protonix.  If symptoms stabilize and patient is ok for discharge, but GI symptoms persist, he may follow-up at [facility] for further evaluation.  Further recs per Dr. Shelle Ironein.  Recommendations: - Possibly mild acute pancreatitis  despite lack of CT findings - continue NPO (advance diet as tolerated), IV fluids, IV pain control, and IV zofran prn - May continue IV protonix to cover for PUD or gastritis - If patient continues to have GI concerns upon d/c he may follow with [facility] as outpatient to discuss EGD  Thank you for the consult. We will follow along with you. Please call with questions or concerns.  Burman Freestone, PA-C [facility] Phone: [phone] Pager: [phone]

## 2015-08-30 LAB — BASIC METABOLIC PANEL
Anion gap: 5 (ref 5–15)
BUN: 11 mg/dL (ref 6–20)
CHLORIDE: 108 mmol/L (ref 101–111)
CO2: 22 mmol/L (ref 22–32)
Calcium: 8.1 mg/dL — ABNORMAL LOW (ref 8.9–10.3)
Creatinine, Ser: 0.76 mg/dL (ref 0.61–1.24)
GFR calc Af Amer: >60 mL/min (ref >60)
GFR calc non Af Amer: >60 mL/min (ref >60)
Glucose, Bld: 103 mg/dL — ABNORMAL HIGH (ref 65–99)
POTASSIUM: 3.5 mmol/L (ref 3.5–5.1)
SODIUM: 135 mmol/L (ref 135–145)

## 2015-08-30 LAB — CBC
HEMATOCRIT: 37.6 % — AB (ref 40.0–52.0)
Hemoglobin: 12.9 g/dL — ABNORMAL LOW (ref 13.0–18.0)
MCH: 31.3 pg (ref 26.0–34.0)
MCHC: 34.4 g/dL (ref 32.0–36.0)
MCV: 91 fL (ref 80.0–100.0)
Platelets: 215 10*3/uL (ref 150–440)
RBC: 4.14 MIL/uL — ABNORMAL LOW (ref 4.40–5.90)
RDW: 13 % (ref 11.5–14.5)
WBC: 9 10*3/uL (ref 3.8–10.6)

## 2015-08-30 LAB — LIPASE, BLOOD: Lipase: 17 U/L (ref 11–51)

## 2015-08-30 MED ORDER — ONDANSETRON 4 MG PO TBDP: 4.0000 mg | ORAL_TABLET | Freq: Three times a day (TID) | ORAL | Status: DC | PRN

## 2015-08-30 MED ORDER — BUTALBITAL-APAP-CAFFEINE 50-325-40 MG PO TABS: 1.0000 | ORAL_TABLET | Freq: Two times a day (BID) | ORAL | Status: DC | PRN

## 2015-08-30 MED ORDER — HYDROCODONE-ACETAMINOPHEN 5-325 MG PO TABS: 1.0000 | ORAL_TABLET | Freq: Four times a day (QID) | ORAL | Status: DC | PRN

## 2015-08-30 NOTE — Discharge Summary (Signed)
[facility] [place] [facility]   PATIENT NAME: Matthew Bartlett    MR#:  [id number]  DATE OF BIRTH:  Aug 13, 1965  DATE OF ADMISSION:  08/29/2015 ADMITTING PHYSICIAN: Altamese DillingVaibhavkumar Mykle Pascua, MD  DATE OF DISCHARGE: 08/30/2015  PRIMARY CARE PHYSICIAN: No primary care provider on file.    ADMISSION DIAGNOSIS:  Intractable pain [R52] Chest pain, unspecified chest pain type [R07.9] Intractable vomiting with nausea, vomiting of unspecified type [R11.10]  DISCHARGE DIAGNOSIS:  Active Problems:   Vomiting   Epigastric pain   Uncontrolled hypertension   SECONDARY DIAGNOSIS:   Past Medical History  Diagnosis Date  . Kidney stones     HOSPITAL COURSE:  * Intractable nausea and vomiting- acute pancreatitis.  Epigastric and abdominal pain.    IV Dilaudid and Zofran for symptom relief.  CT scan of abdomen with angiogram does not show any significant findings, but the renal stone which was visible on the CT scan 2 days ago is now not there. No signs of pancreatitis on CT abdomen, but lipase was mildly high.  Maybe patient passed the renal stone and that's why he had this severe pain and vomiting.  His lipase is slightly elevated, checked his lipid panel.   He also has some complain of burning epigastric pain frequently and some bloating sensation so called GI consult to help  Suggested conservative management for now.  Meanwhile kept him on clear liquid diet and give him Protonix IV twice a day. Upgraded diet and he actually ate regular food brought by his girl friend  Here , while was on liquid diet, as he was hungry. And said, he does not have much pain now.  discharge home today.  * Uncontrolled hypertension  On presentation his initial blood pressure was more than 200  Likely due to intractable pain and vomiting.  After receiving Dilaudid injection it came under control.  * headache   CT head negative.   Given fioricet.  DISCHARGE  CONDITIONS:   Stable.  CONSULTS OBTAINED:  Treatment Team:  Matthew MaxwellMatthew Gordon Rein, MD  DRUG ALLERGIES:   Allergies  Allergen Reactions  . Penicillins Hives, Nausea And Vomiting and Other (See Comments)    Has patient had a PCN reaction causing immediate rash, facial/tongue/throat swelling, SOB or lightheadedness with hypotension: No Has patient had a PCN reaction causing severe rash involving mucus membranes or skin necrosis: No Has patient had a PCN reaction that required hospitalization No Has patient had a PCN reaction occurring within the last 10 years: Yes If all of the above answers are "NO", then may proceed with Cephalosporin use.    DISCHARGE MEDICATIONS:   Current Discharge Medication List    START taking these medications   Details  butalbital-acetaminophen-caffeine (FIORICET, ESGIC) 50-325-40 MG tablet Take 1 tablet by mouth 2 (two) times daily as needed for headache or migraine. Qty: 14 tablet, Refills: 0      CONTINUE these medications which have CHANGED   Details  HYDROcodone-acetaminophen (NORCO/VICODIN) 5-325 MG tablet Take 1 tablet by mouth every 6 (six) hours as needed for moderate pain or severe pain. Qty: 20 tablet, Refills: 0    ondansetron (ZOFRAN ODT) 4 MG disintegrating tablet Take 1 tablet (4 mg total) by mouth every 8 (eight) hours as needed for nausea or vomiting. Qty: 20 tablet, Refills: 0         DISCHARGE INSTRUCTIONS:    Follow with PMD in 2 weeks.  If you experience worsening of your admission symptoms, develop shortness  of breath, life threatening emergency, suicidal or homicidal thoughts you must seek medical attention immediately by calling 911 or calling your MD immediately  if symptoms less severe.  You Must read complete instructions/literature along with all the possible adverse reactions/side effects for all the Medicines you take and that have been prescribed to you. Take any new Medicines after you have completely understood  and accept all the possible adverse reactions/side effects.   Please note  You were cared for by a hospitalist during your hospital stay. If you have any questions about your discharge medications or the care you received while you were in the hospital after you are discharged, you can call the unit and asked to speak with the hospitalist on call if the hospitalist that took care of you is not available. Once you are discharged, your primary care physician will handle any further medical issues. Please note that NO REFILLS for any discharge medications will be authorized once you are discharged, as it is imperative that you return to your primary care physician (or establish a relationship with a primary care physician if you do not have one) for your aftercare needs so that they can reassess your need for medications and monitor your lab values.    Today   CHIEF COMPLAINT:   Chief Complaint  Patient presents with  . Chest Pain  . Nausea    HISTORY OF PRESENT ILLNESS:  Matthew Bartlett  is a 50 y.o. male with a known history of Renal stone, nonobstructing, was admitted 2 days ago with severe epigastric and abdominal pain with intractable vomiting. But on CT scan of the abdomen was found nonobstructing renal stone and he was feeling completely fine yesterday morning and tolerated regular food so I discharged him home. As per him in the evening he was doing fine when he went to bed, this morning again he woke up with the similar thing severe epigastric pain which was going to his back and intractable vomiting so he came back to emergency room. In ER CT angiogram of abdomen and chest were done which did not show any dissections or major abnormalities, troponin is negative, lipase is 121. Patient required multiple Dilaudid injections and Zofran to help him control and so he is given his admission to hospitalist team. On further questioning patient denies any chronic use of pain medications, aspirin or  BC powders.   VITAL SIGNS:  Blood pressure 140/79, pulse 44, temperature 98 F (36.7 C), temperature source Oral, resp. rate 17, height  (1.626 m), weight 108.863 kg (240 lb), SpO2 98 %.  I/O:   Intake/Output Summary (Last 24 hours) at 08/30/15 1235 Last data filed at 08/30/15 1131  Gross per 24 hour  Intake   2963 ml  Output   1650 ml  Net   1313 ml    PHYSICAL EXAMINATION:  GENERAL:  50 y.o.-year-old patient lying in the bed with no acute distress.  EYES: Pupils equal, round, reactive to light and accommodation. No scleral icterus. Extraocular muscles intact.  HEENT: Head atraumatic, normocephalic. Oropharynx and nasopharynx clear.  NECK:  Supple, no jugular venous distention. No thyroid enlargement, no tenderness.  LUNGS: Normal breath sounds bilaterally, no wheezing, rales,rhonchi or crepitation. No use of accessory muscles of respiration.  CARDIOVASCULAR: S1, S2 normal. No murmurs, rubs, or gallops.  ABDOMEN: Soft, non-tender, non-distended. Bowel sounds present. No organomegaly or mass.  EXTREMITIES: No pedal edema, cyanosis, or clubbing.  NEUROLOGIC: Cranial nerves II through XII are intact. Muscle strength  5/5 in all extremities. Sensation intact. Gait not checked.  PSYCHIATRIC: The patient is alert and oriented x 3.  SKIN: No obvious rash, lesion, or ulcer.   DATA REVIEW:   CBC  Recent Labs Lab 08/30/15 0403  WBC 9.0  HGB 12.9*  HCT 37.6*  PLT 215    Chemistries   Recent Labs Lab 08/27/15 1503  08/29/15 0750 08/30/15 0403  NA 137  < > 138 135  K 3.5  < > 3.3* 3.5  CL 104  < > 107 108  CO2 25  < > 24 22  GLUCOSE 161*  < > 137* 103*  BUN 13  < > 18 11  CREATININE 0.99  < > 1.00 0.76  CALCIUM 9.6  < > 8.7* 8.1*  MG 1.9  --   --   --   AST 39  --  36  --   ALT 49  --  51  --   ALKPHOS 71  --  64  --   BILITOT 0.7  --  0.4  --   < > = values in this interval not displayed.  Cardiac Enzymes  Recent Labs Lab 08/29/15 1821  TROPONINI <0.03     Microbiology Results  Results for orders placed or performed in visit on 11/09/11  Urine culture     Status: None   Collection Time: 11/09/11 12:45 PM  Result Value Ref Range Status   Micro Text Report   Final       SOURCE: CC    COMMENT                   NO GROWTH IN 36 HOURS   ANTIBIOTIC                                                        RADIOLOGY:  Ct Head Wo Contrast  08/29/2015  CLINICAL DATA:  Headaches EXAM: CT HEAD WITHOUT CONTRAST TECHNIQUE: Contiguous axial images were obtained from the base of the skull through the vertex without intravenous contrast. COMPARISON:  None. FINDINGS: Bony calvarium is intact. Opacification of the right maxillary antrum is noted likely related to chronic sinusitis. No findings to suggest acute hemorrhage, acute infarction or space-occupying mass lesion are noted. IMPRESSION: No acute abnormality seen. Electronically Signed   By: Alcide Clever M.D.   On: 08/29/2015 08:39   Ct Angio Chest Aorta W/cm &/or Wo/cm  08/29/2015  CLINICAL DATA:  Nausea, vomiting, chest pain, and headache EXAM: CT ANGIOGRAPHY CHEST, ABDOMEN AND PELVIS TECHNIQUE: Multidetector CT imaging through the chest, abdomen and pelvis was performed using the standard protocol during bolus administration of intravenous contrast. Multiplanar reconstructed images and MIPs were obtained and reviewed to evaluate the vascular anatomy. CONTRAST:  100 cc Isovue 370 intravenous COMPARISON:  Abdominal CT 2 days ago FINDINGS: CTA CHEST FINDINGS THORACIC INLET/BODY WALL: No acute abnormality. MEDIASTINUM: Normal heart size. No pericardial effusion. Normal caliber aorta with no dissection or intramural hematoma. Mild atherosclerotic calcification of the arch level. Suspected 2 small areas of atherosclerotic calcification on the LAD. No pulmonary artery filling defect is seen. LUNG WINDOWS: There is no edema, consolidation, effusion, or pneumothorax. OSSEOUS: No acute fracture.  No suspicious lytic  or blastic lesions. Review of the MIP images confirms the above findings. CTA ABDOMEN AND PELVIS FINDINGS  Abdominal wall: Fatty enlargement of the inguinal canals without definitive hernia. Hepatobiliary: Hepatic steatosis without focal lesion.No evidence of biliary obstruction or stone. Pancreas: Unremarkable. Spleen: Unremarkable. Adrenals/Urinary Tract: Negative adrenals. Tiny cyst exophytic from the lower pole left kidney. Right nephrolithiasis noted on prior CT not visible on this enhanced scan. No hydronephrosis. Unremarkable bladder. Reproductive:No pathologic findings. Stomach/Bowel: No obstruction. Mild colonic diverticulosis. No appendicitis. Vascular/Lymphatic: The left hepatic artery is replaced to the left gastric. Otherwise, aortic branching is standard. There is atheromatous thickening and calcification on the aortic wall without aneurysm or dissection. No visceral artery stenosis or occlusion. No mass or adenopathy. Peritoneal: No ascites or pneumoperitoneum. Musculoskeletal: No acute abnormalities. Review of the MIP images confirms the above findings. IMPRESSION: 1. No evidence of acute aortic syndrome or other cause of symptoms. 2. Atherosclerosis. 3. Hepatic steatosis. Electronically Signed   By: Marnee Spring M.D.   On: 08/29/2015 08:47   Ct Cta Abd/pel W/cm &/or W/o Cm  08/29/2015  CLINICAL DATA:  Nausea, vomiting, chest pain, and headache EXAM: CT ANGIOGRAPHY CHEST, ABDOMEN AND PELVIS TECHNIQUE: Multidetector CT imaging through the chest, abdomen and pelvis was performed using the standard protocol during bolus administration of intravenous contrast. Multiplanar reconstructed images and MIPs were obtained and reviewed to evaluate the vascular anatomy. CONTRAST:  100 cc Isovue 370 intravenous COMPARISON:  Abdominal CT 2 days ago FINDINGS: CTA CHEST FINDINGS THORACIC INLET/BODY WALL: No acute abnormality. MEDIASTINUM: Normal heart size. No pericardial effusion. Normal caliber aorta with no  dissection or intramural hematoma. Mild atherosclerotic calcification of the arch level. Suspected 2 small areas of atherosclerotic calcification on the LAD. No pulmonary artery filling defect is seen. LUNG WINDOWS: There is no edema, consolidation, effusion, or pneumothorax. OSSEOUS: No acute fracture.  No suspicious lytic or blastic lesions. Review of the MIP images confirms the above findings. CTA ABDOMEN AND PELVIS FINDINGS Abdominal wall: Fatty enlargement of the inguinal canals without definitive hernia. Hepatobiliary: Hepatic steatosis without focal lesion.No evidence of biliary obstruction or stone. Pancreas: Unremarkable. Spleen: Unremarkable. Adrenals/Urinary Tract: Negative adrenals. Tiny cyst exophytic from the lower pole left kidney. Right nephrolithiasis noted on prior CT not visible on this enhanced scan. No hydronephrosis. Unremarkable bladder. Reproductive:No pathologic findings. Stomach/Bowel: No obstruction. Mild colonic diverticulosis. No appendicitis. Vascular/Lymphatic: The left hepatic artery is replaced to the left gastric. Otherwise, aortic branching is standard. There is atheromatous thickening and calcification on the aortic wall without aneurysm or dissection. No visceral artery stenosis or occlusion. No mass or adenopathy. Peritoneal: No ascites or pneumoperitoneum. Musculoskeletal: No acute abnormalities. Review of the MIP images confirms the above findings. IMPRESSION: 1. No evidence of acute aortic syndrome or other cause of symptoms. 2. Atherosclerosis. 3. Hepatic steatosis. Electronically Signed   By: Marnee Spring M.D.   On: 08/29/2015 08:47    EKG:   Orders placed or performed during the hospital encounter of 08/29/15  . EKG 12-Lead  . EKG 12-Lead  . ED EKG  . ED EKG      Management plans discussed with the patient, family and they are in agreement.  CODE STATUS:     Code Status Orders        Start     Ordered   08/29/15 1217  Full code   Continuous      08/29/15 1216    Code Status History    Date Active Date Inactive Code Status Order ID Comments User Context   08/27/2015  9:03 PM 08/28/2015  4:55 PM Full Code [id number]  Shaune Pollack, MD Inpatient      TOTAL TIME TAKING CARE OF THIS PATIENT: 35 minutes.    Altamese Dilling M.D on 08/30/2015 at 12:35 PM  Between 7am to 6pm - Pager - [phone]  After 6pm go to [email or web address] - Social research officer, government  [facility]  Office  [phone]  CC: Primary care physician; No primary care provider on file.   Note: This dictation was prepared with Dragon dictation along with smaller phrase technology. Any transcriptional errors that result from this process are unintentional.

## 2015-08-30 NOTE — Progress Notes (Signed)
IV and tele were removed. Discharge instructions, follow-up appointments, and prescriptions were provided to the pt. The pt was taken downstairs via wheelchair by volunteer services.  

## 2015-09-02 ENCOUNTER — Other Ambulatory Visit: Payer: Self-pay | Admitting: Radiology

## 2015-09-10 ENCOUNTER — Ambulatory Visit: Payer: Self-pay | Admitting: Urology

## 2015-09-10 ENCOUNTER — Encounter: Payer: Self-pay | Admitting: Urology

## 2015-09-18 ENCOUNTER — Ambulatory Visit: Payer: Self-pay | Admitting: Urology

## 2016-04-20 ENCOUNTER — Encounter: Payer: Self-pay | Admitting: Occupational Medicine

## 2016-04-20 ENCOUNTER — Emergency Department
Admission: EM | Admit: 2016-04-20 | Discharge: 2016-04-20 | Disposition: A | Discharge status: Home / Self Care | Payer: Self-pay | Attending: Emergency Medicine | Admitting: Emergency Medicine

## 2016-04-20 DIAGNOSIS — Y999 Unspecified external cause status: Secondary | ICD-10-CM | POA: Insufficient documentation

## 2016-04-20 DIAGNOSIS — W458XXA Other foreign body or object entering through skin, initial encounter: Secondary | ICD-10-CM | POA: Insufficient documentation

## 2016-04-20 DIAGNOSIS — Y929 Unspecified place or not applicable: Secondary | ICD-10-CM | POA: Insufficient documentation

## 2016-04-20 DIAGNOSIS — T1592XA Foreign body on external eye, part unspecified, left eye, initial encounter: Secondary | ICD-10-CM

## 2016-04-20 DIAGNOSIS — Y9389 Activity, other specified: Secondary | ICD-10-CM | POA: Insufficient documentation

## 2016-04-20 DIAGNOSIS — T1502XA Foreign body in cornea, left eye, initial encounter: Secondary | ICD-10-CM | POA: Insufficient documentation

## 2016-04-20 MED ORDER — TETRACAINE HCL 0.5 % OP SOLN
2.0000 [drp] | Freq: Once | OPHTHALMIC | Status: AC
Start: 2016-04-20 — End: 2016-04-20
  Administered 2016-04-20: 2 [drp] via OPHTHALMIC
  Filled 2016-04-20: qty 2

## 2016-04-20 MED ORDER — KETOROLAC TROMETHAMINE 0.5 % OP SOLN: 1.0000 [drp] | Freq: Four times a day (QID) | OPHTHALMIC | 0 refills | Status: DC

## 2016-04-20 MED ORDER — FLUORESCEIN SODIUM 1 MG OP STRP
1.0000 | ORAL_STRIP | Freq: Once | OPHTHALMIC | Status: AC
  Administered 2016-04-20: 1 via OPHTHALMIC
  Filled 2016-04-20: qty 1

## 2016-04-20 NOTE — ED Triage Notes (Signed)
Pt presents today with something in left eye that happen couple days ago. Pt thinks it rust, metal or dirt. Pt noted to have redness and clear drainage. Pt denies any change in vision. Pt states pain irration pain 8/10.

## 2016-04-20 NOTE — ED Provider Notes (Signed)
[facility] Emergency Department Provider Note  ____________________________________________  Time seen: Approximately 10:38 PM  I have reviewed the triage vital signs and the nursing notes.   HISTORY  Chief Complaint Foreign Body in Eye (left eye)    HPI Matthew Bartlett is a 50 y.o. male who presents to emergency department complaining of foreign body to left eye. Patient states that he is using a wire brush 3 days ago when he felt something fly into his eye. Patient states that initially he thought he could rinse it out and get out with no problems however patient continues to have a foreign body sensation to the left eye. Patient states that today at work, one of his coworkers looked at his eye and so I expect and he is here for foreign body removal. Patient denies any visual changes. Patient does not wear contacts. No other complaints at this time.   Past Medical History:  Diagnosis Date  . Kidney stones     Patient Active Problem List   Diagnosis Date Noted  . Epigastric pain 08/29/2015  . Uncontrolled hypertension 08/29/2015  . Vomiting 08/27/2015    History reviewed. No pertinent surgical history.  Prior to Admission medications   Medication Sig Start Date End Date Taking? Authorizing Provider  butalbital-acetaminophen-caffeine (FIORICET, ESGIC) 50-325-40 MG tablet Take 1 tablet by mouth 2 (two) times daily as needed for headache or migraine. 08/30/15   Altamese DillingVaibhavkumar Vachhani, MD  HYDROcodone-acetaminophen (NORCO/VICODIN) 5-325 MG tablet Take 1 tablet by mouth every 6 (six) hours as needed for moderate pain or severe pain. 08/30/15   Altamese DillingVaibhavkumar Vachhani, MD  ketorolac (ACULAR) 0.5 % ophthalmic solution Place 1 drop into the right eye 4 (four) times daily. 04/20/16   Delorise RoyalsJonathan D Renard Caperton, PA-C  ondansetron (ZOFRAN ODT) 4 MG disintegrating tablet Take 1 tablet (4 mg total) by mouth every 8 (eight) hours as needed for nausea or vomiting. 08/30/15    Altamese DillingVaibhavkumar Vachhani, MD    Allergies Penicillins  Family History  Problem Relation Age of Onset  . Hypertension Father     Social History Social History  Substance Use Topics  . Smoking status: Never Smoker  . Smokeless tobacco: Never Used  . Alcohol use No     Review of Systems  Constitutional: No fever/chills Eyes: No visual changes. No discharge. Positive for foreign body sensation to the left eye Cardiovascular: no chest pain. Respiratory: no cough. No SOB. Musculoskeletal: Negative for musculoskeletal pain. Skin: Negative for rash, abrasions, lacerations, ecchymosis. Neurological: Negative for headaches, focal weakness or numbness. 10-point ROS otherwise negative.  ____________________________________________   PHYSICAL EXAM:  VITAL SIGNS: ED Triage Vitals  Enc Vitals Group     BP 04/20/16 2142 (!) 173/98     Pulse Rate 04/20/16 2142 (!) 58     Resp 04/20/16 2142 18     Temp 04/20/16 2142 98.1 F (36.7 C)     Temp Source 04/20/16 2142 Oral     SpO2 04/20/16 2142 97 %     Weight 04/20/16 2143 240 lb (108.9 kg)     Height 04/20/16 2143 5\' 6"  (1.676 m)     Head Circumference --      Peak Flow --      Pain Score 04/20/16 2148 8     Pain Loc --      Pain Edu? --      Excl. in GC? --      Constitutional: Alert and oriented. Well appearing and in no acute distress.  Eyes: Conjunctivae are normal. PERRL. EOMI. Funduscopic exam reveals good red reflex bilaterally, past history noting disc are visualized with no acute abnormality. No hyphema. Foreign body is visualized over the 1:00 position of the left pupil. Fluorescein staining reveals no other areas of uptake. Head: Atraumatic. Neck: No stridor.    Cardiovascular: Normal rate, regular rhythm. Normal S1 and S2.  Good peripheral circulation. Respiratory: Normal respiratory effort without tachypnea or retractions. Lungs CTAB. Good air entry to the bases with no decreased or absent breath  sounds. Musculoskeletal: Full range of motion to all extremities. No gross deformities appreciated. Neurologic:  Normal speech and language. No gross focal neurologic deficits are appreciated.  Skin:  Skin is warm, dry and intact. No rash noted. Psychiatric: Mood and affect are normal. Speech and behavior are normal. Patient exhibits appropriate insight and judgement.   ____________________________________________   LABS (all labs ordered are listed, but only abnormal results are displayed)  Labs Reviewed - No data to display ____________________________________________  EKG   ____________________________________________  RADIOLOGY   No results found.  ____________________________________________    PROCEDURES  Procedure(s) performed:    .Foreign Body Removal Date/Time: 04/20/2016 11:10 PM Performed by: Gala RomneyUTHRIELL, Liahm Grivas D Authorized by: Gala RomneyUTHRIELL, Bascom Biel D  Consent: Verbal consent obtained. Risks and benefits: risks, benefits and alternatives were discussed Consent given by: patient Patient understanding: patient states understanding of the procedure being performed Body area: eye Location details: left cornea  Anesthesia: Local Anesthetic: tetracaine drops Patient cooperative: yes Localization method: visualized Removal mechanism: 27-gauge needle Eye examined with fluorescein. Fluorescein uptake. No residual rust ring present. Depth: superficial Complexity: simple 1 objects recovered. Objects recovered: metal shaving Post-procedure assessment: foreign body removed Patient tolerance: Patient tolerated the procedure well with no immediate complications      Medications  fluorescein ophthalmic strip 1 strip (1 strip Left Eye Given 04/20/16 2255)  tetracaine (PONTOCAINE) 0.5 % ophthalmic solution 2 drop (2 drops Left Eye Given 04/20/16 2256)     ____________________________________________   INITIAL IMPRESSION / ASSESSMENT AND PLAN / ED  COURSE  Pertinent labs & imaging results that were available during my care of the patient were reviewed by me and considered in my medical decision making (see chart for details).  Review of the [place] CSRS was performed in accordance of the [facility] prior to dispensing any controlled drugs.  Clinical Course     Patient's diagnosis is consistent with Foreign body to the left eye. Patient's exam is reassuring with no visual deficits. Object is removed as described above. No complications.. Patient will be discharged home with prescriptions for Acular eyedrops. Patient is to follow up with ophthalmology as needed or otherwise directed. Patient is given ED precautions to return to the ED for any worsening or new symptoms.     ____________________________________________  FINAL CLINICAL IMPRESSION(S) / ED DIAGNOSES  Final diagnoses:  Foreign body, eye, left, initial encounter      NEW MEDICATIONS STARTED DURING THIS VISIT:  New Prescriptions   KETOROLAC (ACULAR) 0.5 % OPHTHALMIC SOLUTION    Place 1 drop into the right eye 4 (four) times daily.        This chart was dictated using voice recognition software/Dragon. Despite best efforts to proofread, errors can occur which can change the meaning. Any change was purely unintentional.    Racheal PatchesJonathan D Chase Arnall, PA-C 04/20/16 2313    Jeanmarie PlantJames A McShane, MD 04/22/16 [id number]

## 2016-06-30 ENCOUNTER — Emergency Department: Payer: Self-pay

## 2016-06-30 ENCOUNTER — Emergency Department
Admission: EM | Admit: 2016-06-30 | Discharge: 2016-06-30 | Disposition: A | Discharge status: Home / Self Care | Payer: Self-pay | Attending: Emergency Medicine | Admitting: Emergency Medicine

## 2016-06-30 ENCOUNTER — Encounter: Payer: Self-pay | Admitting: Emergency Medicine

## 2016-06-30 DIAGNOSIS — M545 Low back pain, unspecified: Secondary | ICD-10-CM

## 2016-06-30 DIAGNOSIS — R110 Nausea: Secondary | ICD-10-CM | POA: Insufficient documentation

## 2016-06-30 DIAGNOSIS — R319 Hematuria, unspecified: Secondary | ICD-10-CM | POA: Insufficient documentation

## 2016-06-30 DIAGNOSIS — R109 Unspecified abdominal pain: Secondary | ICD-10-CM | POA: Insufficient documentation

## 2016-06-30 LAB — CBC
HEMATOCRIT: 44.7 % (ref 40.0–52.0)
HEMOGLOBIN: 15.6 g/dL (ref 13.0–18.0)
MCH: 32.1 pg (ref 26.0–34.0)
MCHC: 35 g/dL (ref 32.0–36.0)
MCV: 91.6 fL (ref 80.0–100.0)
Platelets: 260 10*3/uL (ref 150–440)
RBC: 4.88 MIL/uL (ref 4.40–5.90)
RDW: 13.2 % (ref 11.5–14.5)
WBC: 11 10*3/uL — AB (ref 3.8–10.6)

## 2016-06-30 LAB — URINALYSIS, COMPLETE (UACMP) WITH MICROSCOPIC
Bacteria, UA: NONE SEEN
Bilirubin Urine: NEGATIVE
GLUCOSE, UA: NEGATIVE mg/dL
Ketones, ur: NEGATIVE mg/dL
Leukocytes, UA: NEGATIVE
NITRITE: NEGATIVE
PROTEIN: NEGATIVE mg/dL
SPECIFIC GRAVITY, URINE: 1.02 (ref 1.005–1.030)
pH: 6 (ref 5.0–8.0)

## 2016-06-30 LAB — BASIC METABOLIC PANEL
ANION GAP: 7 (ref 5–15)
BUN: 15 mg/dL (ref 6–20)
CHLORIDE: 105 mmol/L (ref 101–111)
CO2: 21 mmol/L — ABNORMAL LOW (ref 22–32)
Calcium: 8.8 mg/dL — ABNORMAL LOW (ref 8.9–10.3)
Creatinine, Ser: 0.87 mg/dL (ref 0.61–1.24)
GFR calc Af Amer: >60 mL/min (ref >60)
GLUCOSE: 120 mg/dL — AB (ref 65–99)
POTASSIUM: 3.9 mmol/L (ref 3.5–5.1)
Sodium: 133 mmol/L — ABNORMAL LOW (ref 135–145)

## 2016-06-30 MED ORDER — ONDANSETRON HCL 4 MG/2ML IJ SOLN
4.0000 mg | Freq: Once | INTRAMUSCULAR | Status: AC
  Administered 2016-06-30: 4 mg via INTRAVENOUS
  Filled 2016-06-30: qty 2

## 2016-06-30 MED ORDER — MORPHINE SULFATE (PF) 4 MG/ML IV SOLN
4.0000 mg | Freq: Once | INTRAVENOUS | Status: AC
  Administered 2016-06-30: 4 mg via INTRAVENOUS
  Filled 2016-06-30: qty 1

## 2016-06-30 MED ORDER — BUTALBITAL-APAP-CAFFEINE 50-325-40 MG PO TABS: 1.0000 | ORAL_TABLET | Freq: Four times a day (QID) | ORAL | 0 refills | Status: DC | PRN

## 2016-06-30 MED ORDER — KETOROLAC TROMETHAMINE 30 MG/ML IJ SOLN
30.0000 mg | Freq: Once | INTRAMUSCULAR | Status: AC
  Administered 2016-06-30: 30 mg via INTRAVENOUS

## 2016-06-30 MED ORDER — SODIUM CHLORIDE 0.9 % IV BOLUS (SEPSIS)
1000.0000 mL | Freq: Once | INTRAVENOUS | Status: AC
  Administered 2016-06-30: 1000 mL via INTRAVENOUS

## 2016-06-30 MED ORDER — ONDANSETRON HCL 4 MG/2ML IJ SOLN
4.0000 mg | Freq: Once | INTRAMUSCULAR | Status: AC
  Administered 2016-06-30: 4 mg via INTRAVENOUS

## 2016-06-30 MED ORDER — DIPHENHYDRAMINE HCL 50 MG/ML IJ SOLN
25.0000 mg | Freq: Once | INTRAMUSCULAR | Status: AC
  Administered 2016-06-30: 25 mg via INTRAVENOUS

## 2016-06-30 MED ORDER — NAPROXEN 500 MG PO TABS: 500.0000 mg | ORAL_TABLET | Freq: Two times a day (BID) | ORAL | 2 refills | Status: DC

## 2016-06-30 MED ORDER — DEXTROSE 5 % IV SOLN
20.0000 mg | Freq: Once | INTRAVENOUS | Status: AC
  Administered 2016-06-30: 20 mg via INTRAVENOUS
  Filled 2016-06-30: qty 4

## 2016-06-30 MED ORDER — FENTANYL CITRATE (PF) 100 MCG/2ML IJ SOLN
50.0000 ug | INTRAMUSCULAR | Status: DC | PRN
  Administered 2016-06-30: 50 ug via INTRAVENOUS
  Filled 2016-06-30: qty 2

## 2016-06-30 MED ORDER — DIPHENHYDRAMINE HCL 50 MG/ML IJ SOLN
INTRAMUSCULAR | Status: AC
  Administered 2016-06-30: 25 mg via INTRAVENOUS
  Filled 2016-06-30: qty 1

## 2016-06-30 NOTE — ED Provider Notes (Signed)
[facility] Emergency Department Provider Note   ____________________________________________    I have reviewed the triage vital signs and the nursing notes.   HISTORY  Chief Complaint Flank Pain     HPI Matthew Bartlett is a 51 y.o. male who presents with complaints of back pain. Patient reports the pain is central but somewhat right sided as well. He denies leg weakness. No injury to the area. No fevers or chills. Denies blood in his urine. He does have a history of kidney stones and this feels somewhat similar. No vomiting but a mild nausea. He reports since he has been in the emergency department waiting he has developed a headache as well   Past Medical History:  Diagnosis Date  . Kidney stones     Patient Active Problem List   Diagnosis Date Noted  . Epigastric pain 08/29/2015  . Uncontrolled hypertension 08/29/2015  . Vomiting 08/27/2015    History reviewed. No pertinent surgical history.  Prior to Admission medications   Medication Sig Start Date End Date Taking? Authorizing Provider  butalbital-acetaminophen-caffeine (FIORICET, ESGIC) 50-325-40 MG tablet Take 1-2 tablets by mouth every 6 (six) hours as needed for headache. 06/30/16 06/30/17  Jene Everyobert Ahmar Pickrell, MD  HYDROcodone-acetaminophen (NORCO/VICODIN) 5-325 MG tablet Take 1 tablet by mouth every 6 (six) hours as needed for moderate pain or severe pain. 08/30/15   Altamese DillingVaibhavkumar Vachhani, MD  ketorolac (ACULAR) 0.5 % ophthalmic solution Place 1 drop into the right eye 4 (four) times daily. 04/20/16   Delorise RoyalsJonathan D Cuthriell, PA-C  naproxen (NAPROSYN) 500 MG tablet Take 1 tablet (500 mg total) by mouth 2 (two) times daily with a meal. 06/30/16   Jene Everyobert Lashun Mccants, MD  ondansetron (ZOFRAN ODT) 4 MG disintegrating tablet Take 1 tablet (4 mg total) by mouth every 8 (eight) hours as needed for nausea or vomiting. 08/30/15   Altamese DillingVaibhavkumar Vachhani, MD     Allergies Penicillins  Family History  Problem  Relation Age of Onset  . Hypertension Father     Social History Social History  Substance Use Topics  . Smoking status: Never Smoker  . Smokeless tobacco: Never Used  . Alcohol use No    Review of Systems  Constitutional: No fever/chills Eyes: No visual changes.  ENT: Neck pain Cardiovascular: Denies chest pain. Respiratory: No cough Genitourinary: Negative for dysuria. No hematuria noted Musculoskeletal: As above Skin: Negative for rash. Neurological: Negative for focal weakness  10-point ROS otherwise negative.  ____________________________________________   PHYSICAL EXAM:  VITAL SIGNS: ED Triage Vitals  Enc Vitals Group     BP 06/30/16 0845 (!) 154/80     Pulse Rate 06/30/16 0843 64     Resp 06/30/16 0843 18     Temp 06/30/16 0843 98.8 F (37.1 C)     Temp Source 06/30/16 0843 Oral     SpO2 06/30/16 0843 96 %     Weight 06/30/16 0842 220 lb (99.8 kg)     Height 06/30/16 0842 5\' 6"  (1.676 m)     Head Circumference --      Peak Flow --      Pain Score 06/30/16 0952 10     Pain Loc --      Pain Edu? --      Excl. in GC? --     Constitutional: Alert and oriented. No acute distress. Pleasant and interactive Eyes: Conjunctivae are normal.   Nose: No congestion/rhinnorhea. Mouth/Throat: Mucous membranes are moist.   Neck:  Painless ROM Cardiovascular:  Normal rate, regular rhythm. Grossly normal heart sounds.  Good peripheral circulation. Respiratory: Normal respiratory effort.  No retractions. Lungs CTAB. Gastrointestinal: Soft and nontender. No distention.  No CVA tenderness. Genitourinary: deferred Musculoskeletal: Back: Mild right lower paraspinal tenderness to palpation, no CVA tenderness to palpation. No vertebral tenderness to palpation. Normal range of motion. Normal strength in the lower extremities Neurologic:  Normal speech and language. No gross focal neurologic deficits are appreciated.  Skin:  Skin is warm, dry and intact. No rash  noted. Psychiatric: Mood and affect are normal. Speech and behavior are normal.  ____________________________________________   LABS (all labs ordered are listed, but only abnormal results are displayed)  Labs Reviewed  URINALYSIS, COMPLETE (UACMP) WITH MICROSCOPIC - Abnormal; Notable for the following:       Result Value   Color, Urine YELLOW (*)    APPearance CLEAR (*)    Hgb urine dipstick MODERATE (*)    Squamous Epithelial / LPF 0-5 (*)    All other components within normal limits  BASIC METABOLIC PANEL - Abnormal; Notable for the following:    Sodium 133 (*)    CO2 21 (*)    Glucose, Bld 120 (*)    Calcium 8.8 (*)    All other components within normal limits  CBC - Abnormal; Notable for the following:    WBC 11.0 (*)    All other components within normal limits   ____________________________________________  EKG  None ____________________________________________  RADIOLOGY  CT scan unremarkable ____________________________________________   PROCEDURES  Procedure(s) performed: No    Critical Care performed: No ____________________________________________   INITIAL IMPRESSION / ASSESSMENT AND PLAN / ED COURSE  Pertinent labs & imaging results that were available during my care of the patient were reviewed by me and considered in my medical decision making (see chart for details).  Patient presents with back pain which is primarily right sided. He does have a history of kidney stones. He has blood in his urine however CT scan does not show a stone. He had significant relief with Toradol and morphine. Also opted to give Reglan and Benadryl for his headache which caused significant relief as well. At this point patient is appropriate for discharge as his pain is well-controlled. He is to return if any change in his symptoms. He agrees with plan    ____________________________________________   FINAL CLINICAL IMPRESSION(S) / ED DIAGNOSES  Final diagnoses:   Right flank pain  Acute right-sided low back pain without sciatica  Hematuria, unspecified type      NEW MEDICATIONS STARTED DURING THIS VISIT:  Discharge Medication List as of 06/30/2016  2:05 PM    START taking these medications   Details  naproxen (NAPROSYN) 500 MG tablet Take 1 tablet (500 mg total) by mouth 2 (two) times daily with a meal., Starting Tue 06/30/2016, Print         Note:  This document was prepared using Dragon voice recognition software and may include unintentional dictation errors.    Jene Every, MD 06/30/16 [phone]

## 2016-06-30 NOTE — ED Triage Notes (Signed)
Started with right flank pain last night that radiates around to RLQ. Has had nausea/vomiting per pt. Has not checked temp but reports sweating last night. Appears to be in pain but no distress noted at this time.

## 2016-11-10 ENCOUNTER — Encounter: Payer: Self-pay | Admitting: Emergency Medicine

## 2016-11-10 ENCOUNTER — Emergency Department
Admission: EM | Admit: 2016-11-10 | Discharge: 2016-11-10 | Discharge status: Against Medical Advice | Payer: Self-pay | Attending: Emergency Medicine | Admitting: Emergency Medicine

## 2016-11-10 ENCOUNTER — Emergency Department: Payer: Self-pay

## 2016-11-10 DIAGNOSIS — R0789 Other chest pain: Secondary | ICD-10-CM | POA: Insufficient documentation

## 2016-11-10 DIAGNOSIS — Z5321 Procedure and treatment not carried out due to patient leaving prior to being seen by health care provider: Secondary | ICD-10-CM | POA: Insufficient documentation

## 2016-11-10 LAB — URINALYSIS, COMPLETE (UACMP) WITH MICROSCOPIC
Bacteria, UA: NONE SEEN
Bilirubin Urine: NEGATIVE
Glucose, UA: NEGATIVE mg/dL
KETONES UR: NEGATIVE mg/dL
Leukocytes, UA: NEGATIVE
Nitrite: NEGATIVE
Specific Gravity, Urine: >1.03 — ABNORMAL HIGH (ref 1.005–1.030)
pH: 5.5 (ref 5.0–8.0)

## 2016-11-10 LAB — COMPREHENSIVE METABOLIC PANEL
ALBUMIN: 4.4 g/dL (ref 3.5–5.0)
ALT: 26 U/L (ref 17–63)
AST: 22 U/L (ref 15–41)
Alkaline Phosphatase: 73 U/L (ref 38–126)
Anion gap: 7 (ref 5–15)
BUN: 15 mg/dL (ref 6–20)
CHLORIDE: 107 mmol/L (ref 101–111)
CO2: 27 mmol/L (ref 22–32)
CREATININE: 1.02 mg/dL (ref 0.61–1.24)
Calcium: 9.3 mg/dL (ref 8.9–10.3)
GFR calc Af Amer: >60 mL/min (ref >60)
GFR calc non Af Amer: >60 mL/min (ref >60)
Glucose, Bld: 105 mg/dL — ABNORMAL HIGH (ref 65–99)
POTASSIUM: 3.6 mmol/L (ref 3.5–5.1)
SODIUM: 141 mmol/L (ref 135–145)
Total Bilirubin: 0.8 mg/dL (ref 0.3–1.2)
Total Protein: 8.1 g/dL (ref 6.5–8.1)

## 2016-11-10 LAB — CBC
HCT: 42.3 % (ref 40.0–52.0)
HEMOGLOBIN: 15 g/dL (ref 13.0–18.0)
MCH: 31.8 pg (ref 26.0–34.0)
MCHC: 35.4 g/dL (ref 32.0–36.0)
MCV: 90 fL (ref 80.0–100.0)
PLATELETS: 294 10*3/uL (ref 150–440)
RBC: 4.7 MIL/uL (ref 4.40–5.90)
RDW: 13 % (ref 11.5–14.5)
WBC: 10.3 10*3/uL (ref 3.8–10.6)

## 2016-11-10 LAB — TROPONIN I

## 2016-11-10 MED ORDER — ONDANSETRON 4 MG PO TBDP
4.0000 mg | ORAL_TABLET | Freq: Once | ORAL | Status: AC | PRN
  Administered 2016-11-10: 4 mg via ORAL
  Filled 2016-11-10: qty 1

## 2016-11-10 NOTE — ED Triage Notes (Signed)
Patient with complaint of intermittent right flank pain radiating to his groin that started last night. Patient also complaint of intermittent left side chest pain that started last night. Patient states that he has also been vomiting. Patient states that he has a history of kidney stones.

## 2016-11-15 ENCOUNTER — Emergency Department: Payer: Self-pay

## 2016-11-15 ENCOUNTER — Other Ambulatory Visit: Payer: Self-pay

## 2016-11-15 ENCOUNTER — Inpatient Hospital Stay
Admission: EM | Admit: 2016-11-15 | Discharge: 2016-11-18 | DRG: 871 | Disposition: A | Discharge status: Home / Self Care | Payer: Self-pay | Attending: Internal Medicine | Admitting: Internal Medicine

## 2016-11-15 ENCOUNTER — Encounter: Payer: Self-pay | Admitting: Emergency Medicine

## 2016-11-15 DIAGNOSIS — Z880 Allergy status to penicillin: Secondary | ICD-10-CM

## 2016-11-15 DIAGNOSIS — R51 Headache: Secondary | ICD-10-CM

## 2016-11-15 DIAGNOSIS — N200 Calculus of kidney: Secondary | ICD-10-CM

## 2016-11-15 DIAGNOSIS — R519 Headache, unspecified: Secondary | ICD-10-CM | POA: Diagnosis present

## 2016-11-15 DIAGNOSIS — Z87442 Personal history of urinary calculi: Secondary | ICD-10-CM

## 2016-11-15 DIAGNOSIS — Z8249 Family history of ischemic heart disease and other diseases of the circulatory system: Secondary | ICD-10-CM

## 2016-11-15 DIAGNOSIS — G039 Meningitis, unspecified: Secondary | ICD-10-CM | POA: Diagnosis present

## 2016-11-15 DIAGNOSIS — A419 Sepsis, unspecified organism: Principal | ICD-10-CM | POA: Diagnosis present

## 2016-11-15 DIAGNOSIS — R509 Fever, unspecified: Secondary | ICD-10-CM

## 2016-11-15 DIAGNOSIS — I10 Essential (primary) hypertension: Secondary | ICD-10-CM | POA: Diagnosis present

## 2016-11-15 DIAGNOSIS — R319 Hematuria, unspecified: Secondary | ICD-10-CM

## 2016-11-15 DIAGNOSIS — E876 Hypokalemia: Secondary | ICD-10-CM | POA: Diagnosis present

## 2016-11-15 DIAGNOSIS — R7301 Impaired fasting glucose: Secondary | ICD-10-CM | POA: Diagnosis present

## 2016-11-15 DIAGNOSIS — I160 Hypertensive urgency: Secondary | ICD-10-CM | POA: Diagnosis present

## 2016-11-15 LAB — CBC WITH DIFFERENTIAL/PLATELET
Basophils Absolute: 0 10*3/uL (ref 0–0.1)
Basophils Relative: 0 %
EOS PCT: 0 %
Eosinophils Absolute: 0 10*3/uL (ref 0–0.7)
HCT: 46.6 % (ref 40.0–52.0)
Hemoglobin: 15.9 g/dL (ref 13.0–18.0)
LYMPHS ABS: 0.8 10*3/uL — AB (ref 1.0–3.6)
LYMPHS PCT: 5 %
MCH: 30.8 pg (ref 26.0–34.0)
MCHC: 34 g/dL (ref 32.0–36.0)
MCV: 90.5 fL (ref 80.0–100.0)
MONO ABS: 0.3 10*3/uL (ref 0.2–1.0)
MONOS PCT: 2 %
NEUTROS ABS: 13.3 10*3/uL — AB (ref 1.4–6.5)
Neutrophils Relative %: 93 %
PLATELETS: 280 10*3/uL (ref 150–440)
RBC: 5.15 MIL/uL (ref 4.40–5.90)
RDW: 13.1 % (ref 11.5–14.5)
WBC: 14.4 10*3/uL — ABNORMAL HIGH (ref 3.8–10.6)

## 2016-11-15 LAB — COMPREHENSIVE METABOLIC PANEL
ALBUMIN: 4.4 g/dL (ref 3.5–5.0)
ALT: 24 U/L (ref 17–63)
AST: 36 U/L (ref 15–41)
Alkaline Phosphatase: 69 U/L (ref 38–126)
Anion gap: 10 (ref 5–15)
BUN: 11 mg/dL (ref 6–20)
CHLORIDE: 102 mmol/L (ref 101–111)
CO2: 27 mmol/L (ref 22–32)
CREATININE: 0.97 mg/dL (ref 0.61–1.24)
Calcium: 9.6 mg/dL (ref 8.9–10.3)
GFR calc Af Amer: >60 mL/min (ref >60)
GLUCOSE: 152 mg/dL — AB (ref 65–99)
POTASSIUM: 3.4 mmol/L — AB (ref 3.5–5.1)
Sodium: 139 mmol/L (ref 135–145)
Total Bilirubin: 0.6 mg/dL (ref 0.3–1.2)
Total Protein: 8.3 g/dL — ABNORMAL HIGH (ref 6.5–8.1)

## 2016-11-15 LAB — LIPASE, BLOOD: LIPASE: 31 U/L (ref 11–51)

## 2016-11-15 MED ORDER — AMLODIPINE BESYLATE 5 MG PO TABS: 5.0000 mg | ORAL_TABLET | Freq: Every day | ORAL | 1 refills | Status: DC

## 2016-11-15 MED ORDER — DIPHENHYDRAMINE HCL 50 MG/ML IJ SOLN
25.0000 mg | Freq: Once | INTRAMUSCULAR | Status: AC
  Administered 2016-11-15: 25 mg via INTRAVENOUS

## 2016-11-15 MED ORDER — METOCLOPRAMIDE HCL 5 MG/ML IJ SOLN
10.0000 mg | Freq: Once | INTRAMUSCULAR | Status: AC
  Administered 2016-11-15: 10 mg via INTRAVENOUS

## 2016-11-15 MED ORDER — DEXAMETHASONE SODIUM PHOSPHATE 10 MG/ML IJ SOLN
10.0000 mg | Freq: Once | INTRAMUSCULAR | Status: AC
  Administered 2016-11-15: 10 mg via INTRAVENOUS
  Filled 2016-11-15: qty 1

## 2016-11-15 MED ORDER — HYDROMORPHONE HCL 1 MG/ML IJ SOLN
1.0000 mg | INTRAMUSCULAR | Status: AC
  Administered 2016-11-15: 1 mg via INTRAVENOUS
  Filled 2016-11-15: qty 1

## 2016-11-15 MED ORDER — SODIUM CHLORIDE 0.9 % IV BOLUS (SEPSIS)
1000.0000 mL | Freq: Once | INTRAVENOUS | Status: AC
  Administered 2016-11-15: 1000 mL via INTRAVENOUS

## 2016-11-15 MED ORDER — NAPROXEN 500 MG PO TABS: 500.0000 mg | ORAL_TABLET | Freq: Two times a day (BID) | ORAL | 0 refills | Status: DC

## 2016-11-15 MED ORDER — KETOROLAC TROMETHAMINE 30 MG/ML IJ SOLN
15.0000 mg | INTRAMUSCULAR | Status: AC
  Administered 2016-11-15: 15 mg via INTRAVENOUS

## 2016-11-15 MED ORDER — KETOROLAC TROMETHAMINE 30 MG/ML IJ SOLN
INTRAMUSCULAR | Status: AC
  Administered 2016-11-15: 15 mg via INTRAVENOUS
  Filled 2016-11-15: qty 1

## 2016-11-15 MED ORDER — AMLODIPINE BESYLATE 5 MG PO TABS
5.0000 mg | ORAL_TABLET | Freq: Once | ORAL | Status: AC
  Administered 2016-11-15: 5 mg via ORAL
  Filled 2016-11-15: qty 1

## 2016-11-15 MED ORDER — METOCLOPRAMIDE HCL 5 MG/ML IJ SOLN
INTRAMUSCULAR | Status: AC
  Administered 2016-11-15: 10 mg via INTRAVENOUS
  Filled 2016-11-15: qty 2

## 2016-11-15 MED ORDER — VANCOMYCIN HCL IN DEXTROSE 1-5 GM/200ML-% IV SOLN
1000.0000 mg | Freq: Once | INTRAVENOUS | Status: AC
  Administered 2016-11-16: 1000 mg via INTRAVENOUS
  Filled 2016-11-15: qty 200

## 2016-11-15 MED ORDER — ONDANSETRON HCL 4 MG/2ML IJ SOLN
INTRAMUSCULAR | Status: AC
  Administered 2016-11-15: 4 mg via INTRAVENOUS
  Filled 2016-11-15: qty 2

## 2016-11-15 MED ORDER — SODIUM CHLORIDE 0.9 % IV SOLN
2.0000 g | Freq: Once | INTRAVENOUS | Status: AC
  Administered 2016-11-16: 2 g via INTRAVENOUS
  Filled 2016-11-15: qty 2

## 2016-11-15 MED ORDER — DIPHENHYDRAMINE HCL 50 MG/ML IJ SOLN
INTRAMUSCULAR | Status: AC
  Administered 2016-11-15: 25 mg via INTRAVENOUS
  Filled 2016-11-15: qty 1

## 2016-11-15 NOTE — ED Notes (Signed)
MD at bedside to talk to pt about spinal tap. Pt refused the procedure and verbalized desire to be treated for meningitis without the procedure. Risks and benefits explained by doctor.

## 2016-11-15 NOTE — ED Provider Notes (Signed)
[facility] Emergency Department Provider Note  ____________________________________________  Time seen: Approximately 11:28 PM  I have reviewed the triage vital signs and the nursing notes.   HISTORY  Chief Complaint Emesis; Headache; and Hypertension    HPI Matthew Bartlett is a 51 y.o. male brought to the ED due to bilateral frontal headache behind the eyes which he noticed when he woke up this morning. Patient also reports upper back pain and neck pain which he associates with his headache. Gradually worsening all day and now severe. Also reports nausea and vomiting today. He reports a similar episode in the past. Also complains of acid reflux. No diarrhea or constipation. No fever or chills.  EMS reported an initial blood pressure of 190/100, but on arrival to the ED his blood pressure is 120/70. Patient denies any history of hypertension or renal disease.     Past Medical History:  Diagnosis Date  . Kidney stones      Patient Active Problem List   Diagnosis Date Noted  . Epigastric pain 08/29/2015  . Uncontrolled hypertension 08/29/2015  . Vomiting 08/27/2015     Past Surgical History:  Procedure Laterality Date  . FINGER SURGERY Right    4th digit     Prior to Admission medications   Medication Sig Start Date End Date Taking? Authorizing Provider  amLODipine (NORVASC) 5 MG tablet Take 5 mg by mouth daily.   Yes [provider]  naproxen (NAPROSYN) 500 MG tablet Take 500 mg by mouth 2 (two) times daily with a meal.   Yes [provider]  amLODipine (NORVASC) 5 MG tablet Take 1 tablet (5 mg total) by mouth daily. 11/15/16   Sharman Cheek, MD  naproxen (NAPROSYN) 500 MG tablet Take 1 tablet (500 mg total) by mouth 2 (two) times daily with a meal. 11/15/16   Sharman Cheek, MD     Allergies Penicillins Hives  Family History  Problem Relation Age of Onset  . Hypertension Father     Social History Social  History  Substance Use Topics  . Smoking status: Never Smoker  . Smokeless tobacco: Never Used  . Alcohol use No    Review of Systems  Constitutional:   No fever or chills.  ENT:   No sore throat. No rhinorrhea. Cardiovascular:   No chest pain or syncope. Respiratory:   No dyspnea or cough. Gastrointestinal:   Negative for abdominal pain, vomiting and diarrhea.  Musculoskeletal:   Negative for focal pain or swelling All other systems reviewed and are negative except as documented above in ROS and HPI.  ____________________________________________   PHYSICAL EXAM:  VITAL SIGNS: ED Triage Vitals  Enc Vitals Group     BP 11/15/16 2104 122/74     Pulse Rate 11/15/16 2104 (!) 53     Resp 11/15/16 2104 18     Temp 11/15/16 2104 98.3 F (36.8 C)     Temp Source 11/15/16 2104 Oral     SpO2 11/15/16 2104 96 %     Weight 11/15/16 2104 240 lb (108.9 kg)     Height 11/15/16 2104 5\' 3"  (1.6 m)     Head Circumference --      Peak Flow --      Pain Score 11/15/16 2237 Asleep     Pain Loc --      Pain Edu? --      Excl. in GC? --     Vital signs reviewed, nursing assessments reviewed.   Constitutional:  Alert and oriented. Not in distress. Eyes:   No scleral icterus.  EOMI. No nystagmus. No conjunctival pallor. PERRL. ENT   Head:   Normocephalic and atraumatic.   Nose:   No congestion/rhinnorhea.    Mouth/Throat:   MMM, no pharyngeal erythema. No peritonsillar mass.    Neck:   No meningismus. Full ROM. Hematological/Lymphatic/Immunilogical:   No cervical lymphadenopathy. Cardiovascular:   RRR. Symmetric bilateral radial and DP pulses.  No murmurs.  Respiratory:   Normal respiratory effort without tachypnea/retractions. Breath sounds are clear and equal bilaterally. No wheezes/rales/rhonchi. Gastrointestinal:   Soft and nontender. Non distended. There is no CVA tenderness.  No rebound, rigidity, or guarding. Genitourinary:   deferred Musculoskeletal:   Normal  range of motion in all extremities. No joint effusions.  No lower extremity tenderness.  No edema. Neurologic:   Normal speech and language.  Motor grossly intact. No gross focal neurologic deficits are appreciated.  Skin:    Skin is warm, dry and intact. No rash noted.  No petechiae, purpura, or bullae.  ____________________________________________    LABS (pertinent positives/negatives) (all labs ordered are listed, but only abnormal results are displayed) Labs Reviewed  COMPREHENSIVE METABOLIC PANEL - Abnormal; Notable for the following:       Result Value   Potassium 3.4 (*)    Glucose, Bld 152 (*)    Total Protein 8.3 (*)    All other components within normal limits  CBC WITH DIFFERENTIAL/PLATELET - Abnormal; Notable for the following:    WBC 14.4 (*)    Neutro Abs 13.3 (*)    Lymphs Abs 0.8 (*)    All other components within normal limits  LIPASE, BLOOD   ____________________________________________   EKG    ____________________________________________    RADIOLOGY  Ct Head Wo Contrast  Result Date: 11/15/2016 CLINICAL DATA:  EMS pt from home with c/o headache behind his eyes, N/V and hypertension; pt come in on 6/19 for same but did not stay to be seen; says he fell asleep in the lobby and woke up feeling some better so he left; today he has had the headache since he woke at 6am; pain is worse today and increased after straining to move his bowels this am; pt says he's had N/V all day; pt currently awake and alert, talking in complete coherent sentences EXAM: CT HEAD WITHOUT CONTRAST TECHNIQUE: Contiguous axial images were obtained from the base of the skull through the vertex without intravenous contrast. COMPARISON:  Head CT, 08/29/2015 FINDINGS: Brain: No evidence of acute infarction, hemorrhage, hydrocephalus, extra-axial collection or mass lesion/mass effect. Vascular: No hyperdense vessel or unexpected calcification. Skull: Normal. Negative for fracture or focal  lesion. Sinuses/Orbits: Globes and orbits are unremarkable. There is complete opacification of the right maxillary sinus with medial bulging of the medial wall mild thickening of the remaining walls. This is unchanged from the prior CT. There is also complete opacification of the anterior middle right ethmoid air cells and hypoplastic right frontal sinus. This is also unchanged. Remaining sinuses are clear. Mastoid air cells are clear. Other: None. IMPRESSION: 1. No intracranial abnormality. 2. Right-sided sinus disease as detailed above, which is stable from the prior CT. This still could be the source of the patient's headache. No other abnormalities. Electronically Signed   By: Amie Portland M.D.   On: 11/15/2016 21:59    ____________________________________________   PROCEDURES Procedures  ____________________________________________   INITIAL IMPRESSION / ASSESSMENT AND PLAN / ED COURSE  Pertinent labs & imaging results that  were available during my care of the patient were reviewed by me and considered in my medical decision making (see chart for details).  Patient complains of severe headache. There was an elevated blood pressure reading by EMS but on arrival to the ED his blood pressure is normal. Check a CT scan for any evidence of subarachnoid bleeding. Check labs. Antiemetics. IV fluids for hydration.  Clinical Course as of Nov 16 2326  Sun Nov 15, 2016  2309 CTneg, but pt still with severe headache. Blood pressure much higher.  On reassessment, mental status still normal, but now with temp 100.4. With wbc 14, I advised pt I wanted to do LP to eval for meningitis. He completely refuses, despite long conversation about risks, benefits, alternatives and the nature of the procedure.  He is agreeable to hospitalization for empiric treatment.  [PS]    Clinical Course User Index [PS] Sharman CheekStafford, Jeanett Antonopoulos, MD     ----------------------------------------- 11:33 PM on  11/15/2016 -----------------------------------------  Antibiotics ordered. Case discussed with hospitalist for admission. Records do show patient previously had been on amlodipine with a history of hypertension. I ordered a dose of amlodipine for him now. I think that a large part of his current high blood pressure is due to his uncontrolled pain from the headache. I have ordered him Dilaudid to help with this as well. I checked bilateral upper extremity blood pressures which are equal.  A while after the patient's arrival in the ED, his wife arrived to the treatment room. She was immediately belligerent and berating ED staff including the nurses and myself at every turn. She is not able to participate in the patient's care and a constructive way and was being disruptive and interfering. She was also violating the hospital's policy against photography in treatment areas. For these reasons, I asked for her to be escorted back to the waiting room by security to promote a safe, productive healthcare environment for the patient and staff.  ____________________________________________   FINAL CLINICAL IMPRESSION(S) / ED DIAGNOSES  Final diagnoses:  Frontal headache  Hypertension, unspecified type  Meningitis      New Prescriptions   AMLODIPINE (NORVASC) 5 MG TABLET    Take 1 tablet (5 mg total) by mouth daily.   NAPROXEN (NAPROSYN) 500 MG TABLET    Take 1 tablet (500 mg total) by mouth 2 (two) times daily with a meal.     Portions of this note were generated with dragon dictation software. Dictation errors may occur despite best attempts at proofreading.    Sharman CheekStafford, Larri Brewton, MD 11/15/16 [phone]

## 2016-11-15 NOTE — ED Triage Notes (Signed)
EMS pt from home with c/o headache behind his eyes, N/V and hypertension; pt come in on 6/19 for same but did not stay to be seen; says he fell asleep in the lobby and woke up feeling some better so he left; today he has had the headache since he woke at 6am; pain is worse today and increased after straining to move his bowels this am; pt says he's had N/V all day; pt currently awake and alert, talking in complete coherent sentences;

## 2016-11-15 NOTE — H&P (Signed)
History and Physical   [facility] [place] [facility] Ad[facility] History and Physical [employer] Jacquez Sheetz, D.O.    Patient Name: Matthew Bartlett MR#: [id number] Date of Birth: 05-15-1966 Date of Admission: 11/15/2016  Referring MD/NP/PA: Dr. Scotty CourtStafford Primary Care Physician: Patient, No Pcp Per Patient coming from: Home  Chief Complaint:   HPI: Matthew Bartlett is a 51 y.o. male with a known history of nephrolithiasis presents to the emergency department for evaluation of headache.  Patient was in a usual state of health until 6am today when he woke up with severe headache. Headache has been intermittent and associated with intermittent fevers all day long. Symptoms are worse with straining or lifting. They also have been occurring when he was at rest.  Patient also presented to emergency department June 19 for similar symptoms. He states his headache resolved prior to being seen. He has had intermittent headaches for the last 5 days.  Patient denies  vision changes,weakness, dizziness, chest pain, shortness of breath, N/V/C/D, abdominal pain, dysuria/frequency, changes in mental status.    Otherwise there has been no change in status. Patient has been taking medication as prescribed and there has been no recent change in medication or diet.  No recent antibiotics.  There has been no recent illness, hospitalizations, travel or sick contacts.    EMS/ED Course: Patient refused LP.  He received Merrem, Vanco, for presumed meningitis.  He received Decadron, Benadryl, Dilaudid, Toradol, Reglan for his headache. Medical admission was requested for ongoing management of presumed meningitis, intractable headache and hypertensive urgency.  Review of Systems:  CONSTITUTIONAL: Positive fever, headache as per HPI. No fatigue, weakness, weight gain/loss. EYES: No blurry or double vision. ENT: No tinnitus, postnasal drip, redness or soreness of the oropharynx. RESPIRATORY: No cough, dyspnea, wheeze.   No hemoptysis.  CARDIOVASCULAR: No chest pain, palpitations, syncope, orthopnea. No lower extremity edema.  GASTROINTESTINAL: No nausea, vomiting, abdominal pain, diarrhea, constipation.  No hematemesis, melena or hematochezia. GENITOURINARY: No dysuria, frequency, hematuria. ENDOCRINE: No polyuria or nocturia. No heat or cold intolerance. HEMATOLOGY: No anemia, bruising, bleeding. INTEGUMENTARY: No rashes, ulcers, lesions. MUSCULOSKELETAL: No arthritis, gout, dyspnea. NEUROLOGIC: No numbness, tingling, ataxia, seizure-type activity, weakness. PSYCHIATRIC: No anxiety, depression, insomnia.   Past Medical History:  Diagnosis Date  . Kidney stones     Past Surgical History:  Procedure Laterality Date  . FINGER SURGERY Right    4th digit     reports that he has never smoked. He has never used smokeless tobacco. He reports that he uses drugs, including Marijuana. He reports that he does not drink alcohol.  Allergies  Allergen Reactions  . Penicillins Hives, Nausea And Vomiting and Other (See Comments)    Has patient had a PCN reaction causing immediate rash, facial/tongue/throat swelling, SOB or lightheadedness with hypotension: No Has patient had a PCN reaction causing severe rash involving mucus membranes or skin necrosis: No Has patient had a PCN reaction that required hospitalization No Has patient had a PCN reaction occurring within the last 10 years: Yes If all of the above answers are "NO", then may proceed with Cephalosporin use.    Family History  Problem Relation Age of Onset  . Hypertension Father     Prior to Admission medications   Medication Sig Start Date End Date Taking? Authorizing Provider  amLODipine (NORVASC) 5 MG tablet Take 1 tablet (5 mg total) by mouth daily. 11/15/16   Sharman CheekStafford, Phillip, MD  naproxen (NAPROSYN) 500 MG tablet Take 1 tablet (500 mg total)  by mouth 2 (two) times daily with a meal. 11/15/16   Sharman Cheek, MD    Physical  Exam: Vitals:   11/15/16 2230 11/15/16 2300 11/15/16 2302 11/15/16 2321  BP:  (!) 188/92 (!) 205/78   Pulse: (!) 55     Resp: (!) 28 (!) 23 (!) 23   Temp:    (!) 100.4 F (38 C)  TempSrc:    Oral  SpO2: 97%     Weight:      Height:        GENERAL: 51 y.o.-year-old male patient, well-developed, well-nourished lying in the bed in no acute distress.  Pleasant and cooperative.   HEENT: Head atraumatic, normocephalic. Pupils equal, round, reactive to light and accommodation. No scleral icterus. Extraocular muscles intact. Nares are patent. Oropharynx is clear. Mucus membranes moist. NECK: Supple, full range of motion. No JVD, no bruit heard. No thyroid enlargement, no tenderness, no cervical lymphadenopathy. CHEST: Normal breath sounds bilaterally. No wheezing, rales, rhonchi or crackles. No use of accessory muscles of respiration.  No reproducible chest wall tenderness.  CARDIOVASCULAR: S1, S2 normal. No murmurs, rubs, or gallops. Cap refill <2 seconds. Pulses intact distally.  ABDOMEN: Soft, nondistended, nontender. No rebound, guarding, rigidity. Normoactive bowel sounds present in all four quadrants. No organomegaly or mass. EXTREMITIES: No pedal edema, cyanosis, or clubbing. No calf tenderness or Homan's sign.  NEUROLOGIC: The patient is alert and oriented x 3. Cranial nerves II through XII are grossly intact with no focal sensorimotor deficit. Muscle strength 5/5 in all extremities. Sensation intact. Gait not checked. PSYCHIATRIC:  Normal affect, mood, thought content. SKIN: Warm, dry, and intact without obvious rash, lesion, or ulcer.    Labs on Admission:  CBC:  Recent Labs Lab 11/10/16 2149 11/15/16 2108  WBC 10.3 14.4*  NEUTROABS  --  13.3*  HGB 15.0 15.9  HCT 42.3 46.6  MCV 90.0 90.5  PLT 294 280   Basic Metabolic Panel:  Recent Labs Lab 11/10/16 2149 11/15/16 2108  NA 141 139  K 3.6 3.4*  CL 107 102  CO2 27 27  GLUCOSE 105* 152*  BUN 15 11  CREATININE 1.02  0.97  CALCIUM 9.3 9.6   GFR: Estimated Creatinine Clearance: 97.9 mL/min (by C-G formula based on SCr of 0.97 mg/dL). Liver Function Tests:  Recent Labs Lab 11/10/16 2149 11/15/16 2108  AST 22 36  ALT 26 24  ALKPHOS 73 69  BILITOT 0.8 0.6  PROT 8.1 8.3*  ALBUMIN 4.4 4.4    Recent Labs Lab 11/15/16 2108  LIPASE 31   No results for input(s): AMMONIA in the last 168 hours. Coagulation Profile: No results for input(s): INR, PROTIME in the last 168 hours. Cardiac Enzymes:  Recent Labs Lab 11/10/16 2149  TROPONINI <0.03   BNP (last 3 results) No results for input(s): PROBNP in the last 8760 hours. HbA1C: No results for input(s): HGBA1C in the last 72 hours. CBG: No results for input(s): GLUCAP in the last 168 hours. Lipid Profile: No results for input(s): CHOL, HDL, LDLCALC, TRIG, CHOLHDL, LDLDIRECT in the last 72 hours. Thyroid Function Tests: No results for input(s): TSH, T4TOTAL, FREET4, T3FREE, THYROIDAB in the last 72 hours. Anemia Panel: No results for input(s): VITAMINB12, FOLATE, FERRITIN, TIBC, IRON, RETICCTPCT in the last 72 hours. Urine analysis:    Component Value Date/Time   COLORURINE YELLOW 11/10/2016 2149   APPEARANCEUR CLEAR 11/10/2016 2149   APPEARANCEUR Clear 01/11/2013 2011   LABSPEC >1.030 (H) 11/10/2016 2149   LABSPEC 1.018  01/11/2013 2011   PHURINE 5.5 11/10/2016 2149   GLUCOSEU NEGATIVE 11/10/2016 2149   GLUCOSEU Negative 01/11/2013 2011   HGBUR LARGE (A) 11/10/2016 2149   BILIRUBINUR NEGATIVE 11/10/2016 2149   BILIRUBINUR Negative 01/11/2013 2011   KETONESUR NEGATIVE 11/10/2016 2149   PROTEINUR TRACE (A) 11/10/2016 2149   NITRITE NEGATIVE 11/10/2016 2149   LEUKOCYTESUR NEGATIVE 11/10/2016 2149   LEUKOCYTESUR Negative 01/11/2013 2011   Sepsis Labs: @LABRCNTIP (procalcitonin:4,lacticidven:4) )No results found for this or any previous visit (from the past 240 hour(s)).   Radiological Exams on Admission: Ct Head Wo  Contrast  Result Date: 11/15/2016 CLINICAL DATA:  EMS pt from home with c/o headache behind his eyes, N/V and hypertension; pt come in on 6/19 for same but did not stay to be seen; says he fell asleep in the lobby and woke up feeling some better so he left; today he has had the headache since he woke at 6am; pain is worse today and increased after straining to move his bowels this am; pt says he's had N/V all day; pt currently awake and alert, talking in complete coherent sentences EXAM: CT HEAD WITHOUT CONTRAST TECHNIQUE: Contiguous axial images were obtained from the base of the skull through the vertex without intravenous contrast. COMPARISON:  Head CT, 08/29/2015 FINDINGS: Brain: No evidence of acute infarction, hemorrhage, hydrocephalus, extra-axial collection or mass lesion/mass effect. Vascular: No hyperdense vessel or unexpected calcification. Skull: Normal. Negative for fracture or focal lesion. Sinuses/Orbits: Globes and orbits are unremarkable. There is complete opacification of the right maxillary sinus with medial bulging of the medial wall mild thickening of the remaining walls. This is unchanged from the prior CT. There is also complete opacification of the anterior middle right ethmoid air cells and hypoplastic right frontal sinus. This is also unchanged. Remaining sinuses are clear. Mastoid air cells are clear. Other: None. IMPRESSION: 1. No intracranial abnormality. 2. Right-sided sinus disease as detailed above, which is stable from the prior CT. This still could be the source of the patient's headache. No other abnormalities. Electronically Signed   By: Amie Portland M.D.   On: 11/15/2016 21:59    EKG: Sinus bradycardia at 51 bpm with normal axis and nonspecific ST-T wave changes.   Assessment/Plan  This is a 51 y.o. male with a history of nephrolithiasis now being admitted with:  #. Intractable headache, neck stiffness, presumed meningitis - Admit inpatient - Start IV Abx: Merrem,  Vanco - Follow up blood cultures - Antiemetics, pain control - ID consult  #. Hypertensive urgency - Monitor on telemetry - BP control, hydralazine PRN - Continue Norvasc  Admission status: Inpatient IV Fluids: NS Diet/Nutrition: Heart healthy Consults called: ID  DVT Px: Lovenox, SCDs and early ambulation. Code Status: Full Code  Disposition Plan: To home in 1-2 days  All the records are reviewed and case discussed with ED provider. Management plans discussed with the patient and/or family who express understanding and agree with plan of care.  Lilyonna Steidle D.O. on 11/15/2016 at 11:25 PM Between 7am to 6pm - Pager - [phone] After 6pm go to [email or web address] - password [email or web address] [facility] [place] [facility] Office [phone] CC: Primary care physician; Patient, No Pcp Per   11/15/2016, 11:25 PM

## 2016-11-15 NOTE — ED Notes (Signed)
Called pharmacy to request meropenem

## 2016-11-16 ENCOUNTER — Inpatient Hospital Stay: Payer: Self-pay

## 2016-11-16 DIAGNOSIS — G44011 Episodic cluster headache, intractable: Secondary | ICD-10-CM

## 2016-11-16 LAB — BASIC METABOLIC PANEL
Anion gap: 7 (ref 5–15)
BUN: 11 mg/dL (ref 6–20)
CO2: 25 mmol/L (ref 22–32)
CREATININE: 0.91 mg/dL (ref 0.61–1.24)
Calcium: 9.4 mg/dL (ref 8.9–10.3)
Chloride: 108 mmol/L (ref 101–111)
GFR calc Af Amer: >60 mL/min (ref >60)
GFR calc non Af Amer: >60 mL/min (ref >60)
Glucose, Bld: 145 mg/dL — ABNORMAL HIGH (ref 65–99)
Potassium: 3.6 mmol/L (ref 3.5–5.1)
SODIUM: 140 mmol/L (ref 135–145)

## 2016-11-16 LAB — CBC
HCT: 44.9 % (ref 40.0–52.0)
Hemoglobin: 15.3 g/dL (ref 13.0–18.0)
MCH: 31.5 pg (ref 26.0–34.0)
MCHC: 34 g/dL (ref 32.0–36.0)
MCV: 92.5 fL (ref 80.0–100.0)
PLATELETS: 275 10*3/uL (ref 150–440)
RBC: 4.85 MIL/uL (ref 4.40–5.90)
RDW: 12.8 % (ref 11.5–14.5)
WBC: 17.9 10*3/uL — ABNORMAL HIGH (ref 3.8–10.6)

## 2016-11-16 LAB — VANCOMYCIN, TROUGH: Vancomycin Tr: 13 ug/mL — ABNORMAL LOW (ref 15–20)

## 2016-11-16 MED ORDER — ALBUTEROL SULFATE (2.5 MG/3ML) 0.083% IN NEBU
2.5000 mg | INHALATION_SOLUTION | Freq: Four times a day (QID) | RESPIRATORY_TRACT | Status: DC | PRN
  Filled 2016-11-16: qty 3

## 2016-11-16 MED ORDER — ALPRAZOLAM 0.25 MG PO TABS
0.2500 mg | ORAL_TABLET | Freq: Once | ORAL | Status: AC
  Administered 2016-11-16: 0.25 mg via ORAL
  Filled 2016-11-16: qty 1

## 2016-11-16 MED ORDER — GADOBENATE DIMEGLUMINE 529 MG/ML IV SOLN
20.0000 mL | Freq: Once | INTRAVENOUS | Status: AC | PRN
  Administered 2016-11-16: 20:00:00 20 mL via INTRAVENOUS

## 2016-11-16 MED ORDER — ZOLPIDEM TARTRATE 5 MG PO TABS
5.0000 mg | ORAL_TABLET | Freq: Every evening | ORAL | Status: DC | PRN
  Administered 2016-11-16 – 2016-11-17 (×2): 5 mg via ORAL
  Filled 2016-11-16 (×2): qty 1

## 2016-11-16 MED ORDER — ONDANSETRON HCL 4 MG/2ML IJ SOLN
4.0000 mg | Freq: Four times a day (QID) | INTRAMUSCULAR | Status: DC | PRN
  Administered 2016-11-16 – 2016-11-17 (×4): 4 mg via INTRAVENOUS
  Filled 2016-11-16 (×6): qty 2

## 2016-11-16 MED ORDER — BISACODYL 5 MG PO TBEC
5.0000 mg | DELAYED_RELEASE_TABLET | Freq: Every day | ORAL | Status: DC | PRN
  Filled 2016-11-16: qty 1

## 2016-11-16 MED ORDER — SODIUM CHLORIDE 0.9 % IV SOLN
1.0000 g | Freq: Three times a day (TID) | INTRAVENOUS | Status: DC
  Administered 2016-11-16: 09:00:00 1 g via INTRAVENOUS
  Filled 2016-11-16 (×3): qty 1

## 2016-11-16 MED ORDER — IPRATROPIUM BROMIDE 0.02 % IN SOLN
0.5000 mg | Freq: Four times a day (QID) | RESPIRATORY_TRACT | Status: DC | PRN
  Filled 2016-11-16: qty 2.5

## 2016-11-16 MED ORDER — AMLODIPINE BESYLATE 5 MG PO TABS
5.0000 mg | ORAL_TABLET | Freq: Every day | ORAL | Status: DC
  Administered 2016-11-16: 09:00:00 5 mg via ORAL
  Filled 2016-11-16: qty 1

## 2016-11-16 MED ORDER — KETOROLAC TROMETHAMINE 30 MG/ML IJ SOLN
30.0000 mg | Freq: Four times a day (QID) | INTRAMUSCULAR | Status: DC | PRN
  Administered 2016-11-16 (×2): 30 mg via INTRAVENOUS
  Filled 2016-11-16 (×3): qty 1

## 2016-11-16 MED ORDER — MAGNESIUM CITRATE PO SOLN
1.0000 | Freq: Once | ORAL | Status: DC | PRN
  Filled 2016-11-16: qty 296

## 2016-11-16 MED ORDER — LORAZEPAM 2 MG/ML IJ SOLN
0.5000 mg | Freq: Once | INTRAMUSCULAR | Status: AC
  Administered 2016-11-16: 11:00:00 0.5 mg via INTRAVENOUS
  Filled 2016-11-16: qty 1

## 2016-11-16 MED ORDER — OXYCODONE HCL 5 MG PO TABS
5.0000 mg | ORAL_TABLET | ORAL | Status: DC | PRN
  Administered 2016-11-16 (×3): 5 mg via ORAL
  Filled 2016-11-16 (×2): qty 1

## 2016-11-16 MED ORDER — SENNOSIDES-DOCUSATE SODIUM 8.6-50 MG PO TABS
1.0000 | ORAL_TABLET | Freq: Every evening | ORAL | Status: DC | PRN
  Filled 2016-11-16: qty 1

## 2016-11-16 MED ORDER — DEXTROSE 5 % IV SOLN
2.0000 g | Freq: Two times a day (BID) | INTRAVENOUS | Status: DC
  Administered 2016-11-16 – 2016-11-17 (×4): 2 g via INTRAVENOUS
  Filled 2016-11-16 (×7): qty 2

## 2016-11-16 MED ORDER — MORPHINE SULFATE (PF) 2 MG/ML IV SOLN
2.0000 mg | INTRAVENOUS | Status: DC | PRN
  Administered 2016-11-16 – 2016-11-18 (×6): 2 mg via INTRAVENOUS
  Filled 2016-11-16 (×7): qty 1

## 2016-11-16 MED ORDER — OXYCODONE HCL 5 MG PO TABS
ORAL_TABLET | ORAL | Status: AC
  Filled 2016-11-16: qty 1

## 2016-11-16 MED ORDER — ACETAMINOPHEN 650 MG RE SUPP
650.0000 mg | Freq: Four times a day (QID) | RECTAL | Status: DC | PRN
Start: 2016-11-16 — End: 2016-11-18
  Filled 2016-11-16: qty 1

## 2016-11-16 MED ORDER — ONDANSETRON HCL 4 MG PO TABS
4.0000 mg | ORAL_TABLET | Freq: Four times a day (QID) | ORAL | Status: DC | PRN
Start: 2016-11-16 — End: 2016-11-18
  Filled 2016-11-16: qty 1

## 2016-11-16 MED ORDER — ENOXAPARIN SODIUM 40 MG/0.4ML ~~LOC~~ SOLN
40.0000 mg | SUBCUTANEOUS | Status: DC
  Administered 2016-11-16 (×2): 40 mg via SUBCUTANEOUS
  Filled 2016-11-16 (×4): qty 0.4

## 2016-11-16 MED ORDER — VANCOMYCIN HCL IN DEXTROSE 1-5 GM/200ML-% IV SOLN
1000.0000 mg | Freq: Three times a day (TID) | INTRAVENOUS | Status: DC
  Administered 2016-11-16 – 2016-11-18 (×7): 1000 mg via INTRAVENOUS
  Filled 2016-11-16 (×9): qty 200

## 2016-11-16 MED ORDER — SODIUM CHLORIDE 0.9 % IV SOLN
INTRAVENOUS | Status: DC
  Administered 2016-11-16 – 2016-11-17 (×4): via INTRAVENOUS

## 2016-11-16 MED ORDER — KETOROLAC TROMETHAMINE 30 MG/ML IJ SOLN: 30.0000 mg | Freq: Once | INTRAMUSCULAR | Status: DC

## 2016-11-16 MED ORDER — DEXAMETHASONE SODIUM PHOSPHATE 4 MG/ML IJ SOLN
10.0000 mg | Freq: Once | INTRAMUSCULAR | Status: AC
  Administered 2016-11-16: 10 mg via INTRAVENOUS
  Filled 2016-11-16: qty 3

## 2016-11-16 MED ORDER — ACETAMINOPHEN 325 MG PO TABS
650.0000 mg | ORAL_TABLET | Freq: Four times a day (QID) | ORAL | Status: DC | PRN
  Administered 2016-11-16: 650 mg via ORAL
  Filled 2016-11-16 (×2): qty 2

## 2016-11-16 MED ORDER — MAGNESIUM SULFATE 2 GM/50ML IV SOLN
2.0000 g | Freq: Once | INTRAVENOUS | Status: AC
  Administered 2016-11-16: 12:00:00 2 g via INTRAVENOUS
  Filled 2016-11-16 (×2): qty 50

## 2016-11-16 MED ORDER — ONDANSETRON HCL 4 MG/2ML IJ SOLN
4.0000 mg | Freq: Once | INTRAMUSCULAR | Status: AC
  Administered 2016-11-15: 4 mg via INTRAVENOUS

## 2016-11-16 MED ORDER — HYDRALAZINE HCL 20 MG/ML IJ SOLN
5.0000 mg | Freq: Three times a day (TID) | INTRAMUSCULAR | Status: DC | PRN
  Administered 2016-11-17: 5 mg via INTRAVENOUS
  Filled 2016-11-16: qty 1

## 2016-11-16 NOTE — Progress Notes (Signed)
PT Cancellation Note  Patient Details Name: Matthew Bartlett MRN: [id number] DOB: 1965/09/23   Cancelled Treatment:    Reason Eval/Treat Not Completed: Pain limiting ability to participate (Consult received and chart reviewed.  Patient currently declining participation due to headache, pain; requests re-attempt next date as appropriate.  Of note, after review of role of PT with patient, reports "he is good with all that (walking)" and does not feel like he has difficulty with UE/LEs/mobility at this time.  May be appropriate for screen vs full evaluation as patient tolerating increased mobility.)   Kevontae Burgoon H. Manson PasseyBrown, PT, DPT, NCS 11/16/16, 10:03 AM [phone]

## 2016-11-16 NOTE — Progress Notes (Signed)
Patient ID: Matthew Bartlett, male   DOB: 1965-06-13, 51 y.o.   MRN: [id number]  [facility] PROGRESS NOTE  Matthew Bartlett:[id number] DOB: 15-Jun-1965 DOA: 11/15/2016 PCP: Patient, No Pcp Per  HPI/Subjective: Patient seen earlier after nurse page me that he has 10 out of 10 pain. Patient states that he has headache going on for about a week area and he's had some neck stiffness and tenderness. He also has low back pain and cold sweats. He has no difficulty with his vision or photosensitivity but he states he has some pain behind his eye in his head.  Objective: Vitals:   11/16/16 0738 11/16/16 1321  BP:  (!) 161/68  Pulse:  (!) 49  Resp:  18  Temp: 99.2 F (37.3 C) 97.8 F (36.6 C)    Filed Weights   11/15/16 2104 11/16/16 0141  Weight: 108.9 kg (240 lb) 95.9 kg (211 lb 8 oz)    ROS: Review of Systems  Constitutional: Positive for fever. Negative for chills.  Eyes: Negative for blurred vision.  Respiratory: Negative for cough and shortness of breath.   Cardiovascular: Negative for chest pain.  Gastrointestinal: Negative for abdominal pain, constipation, diarrhea, nausea and vomiting.  Genitourinary: Negative for dysuria.  Musculoskeletal: Positive for back pain, joint pain, myalgias and neck pain.  Neurological: Positive for dizziness and headaches.   Exam: Physical Exam  Constitutional: He is oriented to person, place, and time.  HENT:  Nose: No mucosal edema.  Mouth/Throat: No oropharyngeal exudate or posterior oropharyngeal edema.  Eyes: Conjunctivae, EOM and lids are normal. Pupils are equal, round, and reactive to light.  Neck: No JVD present. Carotid bruit is not present. No edema present. No thyroid mass and no thyromegaly present.  Cardiovascular: S1 normal and S2 normal.  Exam reveals no gallop.   Murmur heard.  Systolic murmur is present with a grade of 2/6  Pulses:      Dorsalis pedis pulses are 2+ on the right side, and 2+ on the left side.   Respiratory: No respiratory distress. He has decreased breath sounds in the left lower field. He has no wheezes. He has no rhonchi. He has no rales.  GI: Soft. Bowel sounds are normal. There is no tenderness.  Musculoskeletal:       Right ankle: He exhibits no swelling.       Left ankle: He exhibits no swelling.  Lymphadenopathy:    He has no cervical adenopathy.  Neurological: He is alert and oriented to person, place, and time. No cranial nerve deficit.  Patient able to straight leg raise bilaterally without an issue. Negative Kernig sign. Negative Brudzinski sign.  Skin: Skin is warm. No rash noted. Nails show no clubbing.  Psychiatric: He has a normal mood and affect.      Data Reviewed: Basic Metabolic Panel:  Recent Labs Lab 11/10/16 2149 11/15/16 2108 11/16/16 0332  NA 141 139 140  K 3.6 3.4* 3.6  CL 107 102 108  CO2 27 27 25   GLUCOSE 105* 152* 145*  BUN 15 11 11   CREATININE 1.02 0.97 0.91  CALCIUM 9.3 9.6 9.4   Liver Function Tests:  Recent Labs Lab 11/10/16 2149 11/15/16 2108  AST 22 36  ALT 26 24  ALKPHOS 73 69  BILITOT 0.8 0.6  PROT 8.1 8.3*  ALBUMIN 4.4 4.4    Recent Labs Lab 11/15/16 2108  LIPASE 31   CBC:  Recent Labs Lab 11/10/16 2149 11/15/16 2108 11/16/16 0332  WBC 10.3  14.4* 17.9*  NEUTROABS  --  13.3*  --   HGB 15.0 15.9 15.3  HCT 42.3 46.6 44.9  MCV 90.0 90.5 92.5  PLT 294 280 275   Cardiac Enzymes:  Recent Labs Lab 11/10/16 2149  TROPONINI <0.03    Studies: Ct Head Wo Contrast  Result Date: 11/15/2016 CLINICAL DATA:  EMS pt from home with c/o headache behind his eyes, N/V and hypertension; pt come in on 6/19 for same but did not stay to be seen; says he fell asleep in the lobby and woke up feeling some better so he left; today he has had the headache since he woke at 6am; pain is worse today and increased after straining to move his bowels this am; pt says he's had N/V all day; pt currently awake and alert, talking in  complete coherent sentences EXAM: CT HEAD WITHOUT CONTRAST TECHNIQUE: Contiguous axial images were obtained from the base of the skull through the vertex without intravenous contrast. COMPARISON:  Head CT, 08/29/2015 FINDINGS: Brain: No evidence of acute infarction, hemorrhage, hydrocephalus, extra-axial collection or mass lesion/mass effect. Vascular: No hyperdense vessel or unexpected calcification. Skull: Normal. Negative for fracture or focal lesion. Sinuses/Orbits: Globes and orbits are unremarkable. There is complete opacification of the right maxillary sinus with medial bulging of the medial wall mild thickening of the remaining walls. This is unchanged from the prior CT. There is also complete opacification of the anterior middle right ethmoid air cells and hypoplastic right frontal sinus. This is also unchanged. Remaining sinuses are clear. Mastoid air cells are clear. Other: None. IMPRESSION: 1. No intracranial abnormality. 2. Right-sided sinus disease as detailed above, which is stable from the prior CT. This still could be the source of the patient's headache. No other abnormalities. Electronically Signed   By: Amie Portlandavid  Ormond M.D.   On: 11/15/2016 21:59   Dg Hand 2 View Right  Result Date: 11/16/2016 CLINICAL DATA:  MRI clearance. History of metal in right ring finger. EXAM: RIGHT HAND - 2 VIEW COMPARISON:  No recent prior. FINDINGS: Soft tissue structures are unremarkable. Deformity noted of the middle phalanx of the right fourth digit and right fifth metacarpal. These changes are most likely posttraumatic. No acute abnormality P No metallic foreign bodies noted . IMPRESSION: No metallic foreign bodies. Electronically Signed   By: Maisie Fushomas  Register   On: 11/16/2016 10:51    Scheduled Meds: . amLODipine  5 mg Oral Daily  . enoxaparin (LOVENOX) injection  40 mg Subcutaneous Q24H  . ketorolac  30 mg Intravenous Once   Continuous Infusions: . sodium chloride 100 mL/hr at 11/16/16 0231  .  meropenem (MERREM) IV Stopped (11/16/16 1009)  . vancomycin Stopped (11/16/16 0854)    Assessment/Plan:  1. Clinical sepsis with suspected meningitis, leukocytosis, fever. Case discussed with pharmacist and we will try Rocephin instead of meropenem. Continue vancomycin follow-up blood cultures. Patient refused lumbar puncture making it difficult to figure out what is going on. 2. Severe headache and neck pain. MRI of the brain and cervical spine ordered. Magnesium and Decadron given. 3. Essential hypertension on Norvasc 4. Impaired fasting glucose check a hemoglobin A1c  Code Status:     Code Status Orders        Start     Ordered   11/16/16 0126  Full code  Continuous     11/16/16 0125    Code Status History    Date Active Date Inactive Code Status Order ID Comments User Context   08/29/2015 12:17  PM 08/30/2015  7:46 PM Full Code [id number]  Altamese Dilling, MD Inpatient   08/27/2015  9:03 PM 08/28/2015  4:55 PM Full Code [id number]  Shaune Pollack, MD Inpatient      Disposition Plan: To be determined based on clinical course  Consultants:  Neurology  Antibiotics:  Rocephin  Vancomycin  Time spent: 28 minutes  Alford Highland  [employer]

## 2016-11-16 NOTE — ED Notes (Signed)
Admitting Provider at bedside. 

## 2016-11-16 NOTE — Progress Notes (Signed)
Pharmacy Antibiotic Note  Assunta CurtisCharles L Hackmann is a 51 y.o. male admitted on 11/15/2016 with meningitis.  Pharmacy has been consulted for vanc/meropenem dosing.  Plan: Patient received vanc 1g IV and meropenem 1g IV x 1 in ED  Will follow up w/ vanc 1g IV q8h w/ 6 hour stack dose Will draw a vanc trough 6/26 @ 0600 prior to 4th dose. Ke 0.0852 T1/2 8 hours Css 15 mcg/mL  Will start meropenem 1g IV q8h  Height: 5\' 3"  (160 cm) Weight: 211 lb 8 oz (95.9 kg) IBW/kg (Calculated) : 56.9  Temp (24hrs), Avg:99 F (37.2 C), Min:98 F (36.7 C), Max:100.4 F (38 C)   Recent Labs Lab 11/10/16 2149 11/15/16 2108 11/16/16 0332  WBC 10.3 14.4* 17.9*  CREATININE 1.02 0.97 0.91  VANCOTROUGH  --   --  13*    Estimated Creatinine Clearance: 97.4 mL/min (by C-G formula based on SCr of 0.91 mg/dL).    Allergies  Allergen Reactions  . Penicillins Hives, Nausea And Vomiting and Other (See Comments)    Has patient had a PCN reaction causing immediate rash, facial/tongue/throat swelling, SOB or lightheadedness with hypotension: No Has patient had a PCN reaction causing severe rash involving mucus membranes or skin necrosis: No Has patient had a PCN reaction that required hospitalization No Has patient had a PCN reaction occurring within the last 10 years: Yes If all of the above answers are "NO", then may proceed with Cephalosporin use.    Thank you for allowing pharmacy to be a part of this patient's care.  Thomasene Rippleavid Johanna Stafford, PharmD, BCPS Clinical Pharmacist 11/16/2016

## 2016-11-16 NOTE — Progress Notes (Signed)
ANTIBIOTIC CONSULT NOTE - INITIAL  Pharmacy Consult for Rocephin Indication: meningitis   Allergies  Allergen Reactions  . Penicillins Hives, Nausea And Vomiting and Other (See Comments)    Has patient had a PCN reaction causing immediate rash, facial/tongue/throat swelling, SOB or lightheadedness with hypotension: No Has patient had a PCN reaction causing severe rash involving mucus membranes or skin necrosis: No Has patient had a PCN reaction that required hospitalization No Has patient had a PCN reaction occurring within the last 10 years: Yes If all of the above answers are "NO", then may proceed with Cephalosporin use.    Patient Measurements: Height: 5\' 3"  (160 cm) Weight: 211 lb 8 oz (95.9 kg) IBW/kg (Calculated) : 56.9 Adjusted Body Weight:   Vital Signs: Temp: 97.8 F (36.6 C) (06/25 1321) Temp Source: Oral (06/25 0738) BP: 161/68 (06/25 1321) Pulse Rate: 49 (06/25 1321) Intake/Output from previous day: 06/24 0701 - 06/25 0700 In: 1048.3 [I.V.:48.3; IV Piggyback:1000] Out: 600 [Urine:600] Intake/Output from this shift: Total I/O In: 0  Out: 500 [Urine:500]  Labs:  Recent Labs  11/15/16 2108 11/16/16 0332  WBC 14.4* 17.9*  HGB 15.9 15.3  PLT 280 275  CREATININE 0.97 0.91   Estimated Creatinine Clearance: 97.4 mL/min (by C-G formula based on SCr of 0.91 mg/dL).  Recent Labs  11/16/16 0332  VANCOTROUGH 13*     Microbiology: No results found for this or any previous visit (from the past 720 hour(s)).  Medical History: Past Medical History:  Diagnosis Date  . Kidney stones     Medications:  Scheduled:  . amLODipine  5 mg Oral Daily  . enoxaparin (LOVENOX) injection  40 mg Subcutaneous Q24H  . ketorolac  30 mg Intravenous Once   Assessment: Pharmacy consulted to dose and monitor Ceftriaxone in this 51 year old male with suspected meningitis. Patient is currently refusing lumbar puncture therefore no way of differentiating between bacterial vs  viral meningitis   Goal of Therapy:    Plan:  Will start Ceftriaxone 2 g IV q12 hours   Sandon Yoho D 11/16/2016,3:31 PM

## 2016-11-16 NOTE — Consult Note (Signed)
Reason for Consult:Headache Referring Physician: Renae GlossWieting  CC: Headache  HPI: Matthew CurtisCharles L Bartlett is an 51 y.o. male with a history of kidney stones who reports acute onset of headache and neck pain after defecating.  Patient reports awakening yesterday and having acute onset of headache that he describes as being occipital and in the neck.  It occurs after defecating.  He will then develop nausea and vomiting.  Drinks water so he will not dry heave.  Reports associated hot and cold sweats.  Feels he has a fever did not take his temperature.  Due to the severity of his headache, reported for evaluation. Patient reports having a similar episode earlier this year where he was hospitalized for 4 days.  I do not see record of this visit.  I do see where he was seen in the ED for abdominal pain, nausea, vomiting after which he later reported that he developed a headache.  There was no associated admission.  Most of his encounters have been related to abdominal issues.     Past Medical History:  Diagnosis Date  . Kidney stones     Past Surgical History:  Procedure Laterality Date  . FINGER SURGERY Right    4th digit    Family History  Problem Relation Age of Onset  . Hypertension Father     Social History:  reports that he has never smoked. He has never used smokeless tobacco. He reports that he uses drugs, including Marijuana. He reports that he does not drink alcohol.  Allergies  Allergen Reactions  . Penicillins Hives, Nausea And Vomiting and Other (See Comments)    Has patient had a PCN reaction causing immediate rash, facial/tongue/throat swelling, SOB or lightheadedness with hypotension: No Has patient had a PCN reaction causing severe rash involving mucus membranes or skin necrosis: No Has patient had a PCN reaction that required hospitalization No Has patient had a PCN reaction occurring within the last 10 years: Yes If all of the above answers are "NO", then may proceed with  Cephalosporin use.    Medications:  I have reviewed the patient's current medications. Prior to Admission:  Prescriptions Prior to Admission  Medication Sig Dispense Refill Last Dose  . amLODipine (NORVASC) 5 MG tablet Take 5 mg by mouth daily.     . naproxen (NAPROSYN) 500 MG tablet Take 500 mg by mouth 2 (two) times daily with a meal.      Scheduled: . amLODipine  5 mg Oral Daily  . enoxaparin (LOVENOX) injection  40 mg Subcutaneous Q24H  . ketorolac  30 mg Intravenous Once    ROS: History obtained from the patient  General ROS: negative for - chills, fatigue, fever, night sweats, weight gain or weight loss Psychological ROS: negative for - behavioral disorder, hallucinations, memory difficulties, mood swings or suicidal ideation Ophthalmic ROS: negative for - blurry vision, double vision, eye pain or loss of vision ENT ROS: negative for - epistaxis, nasal discharge, oral lesions, sore throat, tinnitus or vertigo Allergy and Immunology ROS: negative for - hives or itchy/watery eyes Hematological and Lymphatic ROS: negative for - bleeding problems, bruising or swollen lymph nodes Endocrine ROS: negative for - galactorrhea, hair pattern changes, polydipsia/polyuria or temperature intolerance Respiratory ROS: negative for - cough, hemoptysis, shortness of breath or wheezing Cardiovascular ROS: negative for - chest pain, dyspnea on exertion, edema or irregular heartbeat Gastrointestinal ROS: nausea/vomiting Genito-Urinary ROS: negative for - dysuria, hematuria, incontinence or urinary frequency/urgency Musculoskeletal ROS: negative for - joint swelling or  muscular weakness Neurological ROS: as noted in HPI Dermatological ROS: negative for rash and skin lesion changes  Physical Examination: Blood pressure (!) 161/68, pulse (!) 49, temperature 97.8 F (36.6 C), resp. rate 18, height 5\' 3"  (1.6 m), weight 95.9 kg (211 lb 8 oz), SpO2 94 %.  Gen: NAD HEENT-  Normocephalic, no  lesions, without obvious abnormality.  Normal external eye and conjunctiva.  Normal TM's bilaterally.  Normal auditory canals and external ears. Normal external nose, mucus membranes and septum.  Normal pharynx. Cardiovascular- S1, S2 normal, pulses palpable throughout   Lungs- chest clear, no wheezing, rales, normal symmetric air entry Abdomen- soft, non-tender; bowel sounds normal; no masses,  no organomegaly Extremities- no edema Lymph-no adenopathy palpable Musculoskeletal-no joint tenderness, deformity or swelling Skin-warm and dry, no hyperpigmentation, vitiligo, or suspicious lesions  Neurological Examination   Mental Status: Alert, oriented, thought content appropriate.  Speech fluent without evidence of aphasia.  Able to follow 3 step commands without difficulty. Cranial Nerves: II: Discs flat bilaterally; Visual fields grossly normal, pupils equal, round, reactive to light and accommodation III,IV, VI: ptosis not present, extra-ocular motions intact bilaterally V,VII: smile symmetric, facial light touch sensation normal bilaterally VIII: hearing normal bilaterally IX,X: gag reflex present XI: bilateral shoulder shrug XII: midline tongue extension Motor: Right : Upper extremity   5/5    Left:     Upper extremity   5/5  Lower extremity   5/5     Lower extremity   5/5 Tone and bulk:normal tone throughout; no atrophy noted Sensory: Pinprick and light touch intact throughout, bilaterally Deep Tendon Reflexes: 2+ and symmetric throughout Plantars: Right: downgoing   Left: downgoing Cerebellar: Normal finger-to-nose and normal heel-to-shin testing bilaterally Gait: not tested due to severe pain     Laboratory Studies:   Basic Metabolic Panel:  Recent Labs Lab 11/10/16 2149 11/15/16 2108 11/16/16 0332  NA 141 139 140  K 3.6 3.4* 3.6  CL 107 102 108  CO2 27 27 25   GLUCOSE 105* 152* 145*  BUN 15 11 11   CREATININE 1.02 0.97 0.91  CALCIUM 9.3 9.6 9.4    Liver  Function Tests:  Recent Labs Lab 11/10/16 2149 11/15/16 2108  AST 22 36  ALT 26 24  ALKPHOS 73 69  BILITOT 0.8 0.6  PROT 8.1 8.3*  ALBUMIN 4.4 4.4    Recent Labs Lab 11/15/16 2108  LIPASE 31   No results for input(s): AMMONIA in the last 168 hours.  CBC:  Recent Labs Lab 11/10/16 2149 11/15/16 2108 11/16/16 0332  WBC 10.3 14.4* 17.9*  NEUTROABS  --  13.3*  --   HGB 15.0 15.9 15.3  HCT 42.3 46.6 44.9  MCV 90.0 90.5 92.5  PLT 294 280 275    Cardiac Enzymes:  Recent Labs Lab 11/10/16 2149  TROPONINI <0.03    BNP: Invalid input(s): POCBNP  CBG: No results for input(s): GLUCAP in the last 168 hours.  Microbiology: Results for orders placed or performed in visit on 11/09/11  Urine culture     Status: None   Collection Time: 11/09/11 12:45 PM  Result Value Ref Range Status   Micro Text Report   Final       SOURCE: CC    COMMENT                   NO GROWTH IN 36 HOURS   ANTIBIOTIC  Coagulation Studies: No results for input(s): LABPROT, INR in the last 72 hours.  Urinalysis:  Recent Labs Lab 11/10/16 2149  COLORURINE YELLOW  LABSPEC >1.030*  PHURINE 5.5  GLUCOSEU NEGATIVE  HGBUR LARGE*  BILIRUBINUR NEGATIVE  KETONESUR NEGATIVE  PROTEINUR TRACE*  NITRITE NEGATIVE  LEUKOCYTESUR NEGATIVE    Lipid Panel:     Component Value Date/Time   CHOL 170 08/29/2015 0750   TRIG 135 08/29/2015 0750   HDL 34 (L) 08/29/2015 0750   CHOLHDL 5.0 08/29/2015 0750   VLDL 27 08/29/2015 0750   LDLCALC 109 (H) 08/29/2015 0750    HgbA1C: No results found for: HGBA1C  Urine Drug Screen:  No results found for: LABOPIA, COCAINSCRNUR, LABBENZ, AMPHETMU, THCU, LABBARB  Alcohol Level: No results for input(s): ETH in the last 168 hours.  Other results: EKG: sinus rhythm at 51 bpm.  Imaging: Ct Head Wo Contrast  Result Date: 11/15/2016 CLINICAL DATA:  EMS pt from home with c/o headache behind his eyes,  N/V and hypertension; pt come in on 6/19 for same but did not stay to be seen; says he fell asleep in the lobby and woke up feeling some better so he left; today he has had the headache since he woke at 6am; pain is worse today and increased after straining to move his bowels this am; pt says he's had N/V all day; pt currently awake and alert, talking in complete coherent sentences EXAM: CT HEAD WITHOUT CONTRAST TECHNIQUE: Contiguous axial images were obtained from the base of the skull through the vertex without intravenous contrast. COMPARISON:  Head CT, 08/29/2015 FINDINGS: Brain: No evidence of acute infarction, hemorrhage, hydrocephalus, extra-axial collection or mass lesion/mass effect. Vascular: No hyperdense vessel or unexpected calcification. Skull: Normal. Negative for fracture or focal lesion. Sinuses/Orbits: Globes and orbits are unremarkable. There is complete opacification of the right maxillary sinus with medial bulging of the medial wall mild thickening of the remaining walls. This is unchanged from the prior CT. There is also complete opacification of the anterior middle right ethmoid air cells and hypoplastic right frontal sinus. This is also unchanged. Remaining sinuses are clear. Mastoid air cells are clear. Other: None. IMPRESSION: 1. No intracranial abnormality. 2. Right-sided sinus disease as detailed above, which is stable from the prior CT. This still could be the source of the patient's headache. No other abnormalities. Electronically Signed   By: Amie Portland M.D.   On: 11/15/2016 21:59   Dg Hand 2 View Right  Result Date: 11/16/2016 CLINICAL DATA:  MRI clearance. History of metal in right ring finger. EXAM: RIGHT HAND - 2 VIEW COMPARISON:  No recent prior. FINDINGS: Soft tissue structures are unremarkable. Deformity noted of the middle phalanx of the right fourth digit and right fifth metacarpal. These changes are most likely posttraumatic. No acute abnormality P No metallic foreign  bodies noted . IMPRESSION: No metallic foreign bodies. Electronically Signed   By: Maisie Fus  Register   On: 11/16/2016 10:51     Assessment/Plan: 51 year old male admitted with headache and neck pain.  Reports this is his second episode of this.  Developed a low grade temperature.  White blood cell count elevated.  Refuses LP therefore placed on Merrem and Vancomycin for coverage.  Head CT reviewed and shows no acute changes.  There is some evidence of right sinus disease.  Unclear etiology of patient's presentation.  Without LP difficult to rule out bacterial versus viral meningitis.  Would also consider tick borne illness and porphyria.  Further work up recommended.  Recommendations: 1.  Lyme and RMSF titers, porphobilinogen 2.  Agree with ID involvement 3.  MRI of the brain with and without contrast   Thana Farr, MD Neurology [phone] 11/16/2016, 2:22 PM

## 2016-11-16 NOTE — ED Notes (Signed)
Transport patient to room 108 from ED. AS

## 2016-11-16 NOTE — Care Management Important Message (Signed)
Important Message  Patient Details  Name: Matthew Bartlett MRN: [id number] Date of Birth: Jun 04, 1965   Medicare Important Message Given:  Yes    Gwenette GreetBrenda S Aleina Burgio, RN 11/16/2016, 8:40 AM

## 2016-11-17 ENCOUNTER — Inpatient Hospital Stay: Payer: Self-pay

## 2016-11-17 LAB — ROCKY MTN SPOTTED FVR ABS PNL(IGG+IGM)
RMSF IgG: NEGATIVE
RMSF IgM: 0.49 index (ref 0.00–0.89)

## 2016-11-17 LAB — CBC
HEMATOCRIT: 46.6 % (ref 40.0–52.0)
HEMOGLOBIN: 16.2 g/dL (ref 13.0–18.0)
MCH: 31.5 pg (ref 26.0–34.0)
MCHC: 34.7 g/dL (ref 32.0–36.0)
MCV: 90.7 fL (ref 80.0–100.0)
Platelets: 284 10*3/uL (ref 150–440)
RBC: 5.14 MIL/uL (ref 4.40–5.90)
RDW: 13.2 % (ref 11.5–14.5)
WBC: 25.2 10*3/uL — ABNORMAL HIGH (ref 3.8–10.6)

## 2016-11-17 LAB — PROCALCITONIN: Procalcitonin: <0.1 ng/mL

## 2016-11-17 LAB — BASIC METABOLIC PANEL
Anion gap: 8 (ref 5–15)
BUN: 16 mg/dL (ref 6–20)
CHLORIDE: 102 mmol/L (ref 101–111)
CO2: 25 mmol/L (ref 22–32)
Calcium: 8.9 mg/dL (ref 8.9–10.3)
Creatinine, Ser: 0.77 mg/dL (ref 0.61–1.24)
GFR calc non Af Amer: >60 mL/min (ref >60)
Glucose, Bld: 117 mg/dL — ABNORMAL HIGH (ref 65–99)
POTASSIUM: 3.4 mmol/L — AB (ref 3.5–5.1)
Sodium: 135 mmol/L (ref 135–145)

## 2016-11-17 LAB — HEMOGLOBIN A1C
HEMOGLOBIN A1C: 5.8 % — AB (ref 4.8–5.6)
MEAN PLASMA GLUCOSE: 120 mg/dL

## 2016-11-17 LAB — HIV ANTIBODY (ROUTINE TESTING W REFLEX): HIV Screen 4th Generation wRfx: NONREACTIVE

## 2016-11-17 LAB — B. BURGDORFI ANTIBODIES: B burgdorferi Ab IgG+IgM: <0.91 {ISR} (ref 0.00–0.90)

## 2016-11-17 LAB — VANCOMYCIN, TROUGH: Vancomycin Tr: 15 ug/mL (ref 15–20)

## 2016-11-17 MED ORDER — ALPRAZOLAM 0.25 MG PO TABS
0.2500 mg | ORAL_TABLET | Freq: Three times a day (TID) | ORAL | Status: DC | PRN
  Administered 2016-11-17 (×2): 0.25 mg via ORAL
  Filled 2016-11-17 (×2): qty 1

## 2016-11-17 MED ORDER — METOCLOPRAMIDE HCL 5 MG/ML IJ SOLN
10.0000 mg | Freq: Once | INTRAMUSCULAR | Status: AC
  Administered 2016-11-17: 22:00:00 10 mg via INTRAVENOUS
  Filled 2016-11-17: qty 2

## 2016-11-17 MED ORDER — HYDROMORPHONE HCL 2 MG PO TABS: 2.0000 mg | ORAL_TABLET | ORAL | Status: DC | PRN

## 2016-11-17 MED ORDER — AMLODIPINE BESYLATE 10 MG PO TABS
10.0000 mg | ORAL_TABLET | Freq: Every day | ORAL | Status: DC
  Administered 2016-11-17 – 2016-11-18 (×2): 10 mg via ORAL
  Filled 2016-11-17 (×2): qty 1

## 2016-11-17 MED ORDER — HYDROCHLOROTHIAZIDE 25 MG PO TABS
25.0000 mg | ORAL_TABLET | Freq: Every day | ORAL | Status: DC
  Administered 2016-11-17 – 2016-11-18 (×2): 25 mg via ORAL
  Filled 2016-11-17 (×2): qty 1

## 2016-11-17 MED ORDER — HYDROMORPHONE HCL 1 MG/ML IJ SOLN
0.5000 mg | Freq: Once | INTRAMUSCULAR | Status: AC
  Administered 2016-11-17: 13:00:00 0.5 mg via INTRAVENOUS
  Filled 2016-11-17: qty 0.5

## 2016-11-17 MED ORDER — HYDROMORPHONE HCL 2 MG PO TABS
2.0000 mg | ORAL_TABLET | ORAL | Status: DC | PRN
  Administered 2016-11-17 – 2016-11-18 (×3): 2 mg via ORAL
  Filled 2016-11-17 (×3): qty 1

## 2016-11-17 MED ORDER — DOXYCYCLINE HYCLATE 100 MG IV SOLR
100.0000 mg | Freq: Two times a day (BID) | INTRAVENOUS | Status: AC
  Administered 2016-11-17 – 2016-11-18 (×3): 100 mg via INTRAVENOUS
  Filled 2016-11-17 (×4): qty 100

## 2016-11-17 MED ORDER — POTASSIUM CHLORIDE CRYS ER 20 MEQ PO TBCR
40.0000 meq | EXTENDED_RELEASE_TABLET | Freq: Once | ORAL | Status: AC
  Administered 2016-11-17: 40 meq via ORAL
  Filled 2016-11-17: qty 2

## 2016-11-17 MED ORDER — BUTALBITAL-APAP-CAFFEINE 50-325-40 MG PO TABS
1.0000 | ORAL_TABLET | Freq: Four times a day (QID) | ORAL | Status: DC | PRN
  Administered 2016-11-17: 1 via ORAL
  Filled 2016-11-17: qty 1

## 2016-11-17 MED ORDER — CYCLOBENZAPRINE HCL 10 MG PO TABS
10.0000 mg | ORAL_TABLET | Freq: Three times a day (TID) | ORAL | Status: DC | PRN
  Administered 2016-11-17: 10 mg via ORAL
  Filled 2016-11-17 (×2): qty 1

## 2016-11-17 NOTE — Progress Notes (Signed)
PT Cancellation Note  Patient Details Name: Matthew Bartlett MRN: [id number] DOB: 30-Jun-1965   Cancelled Treatment:    Reason Eval/Treat Not Completed: PT screened, no needs identified, will sign off.  PT refuses OOB activity as he just received dilaudid; however, pt agreeable to strength testing and questioning from this therapist.  Pt is independent at baseline with all aspects of mobility working full-time as a [employer].   He denies any falls in the past 6 months and does not use an AD at baseline.  Strength testing revealed strength grossly WFL BUEs and BLEs.  Pt denies noticing any changes in his balance or strength since onset of current symptoms and has been up to bathroom in room multiple times since admission without any issues.  PT will sign off at this time.  Please place new consult if needs change.  Encarnacion ChuAshley Abashian PT, DPT 11/17/2016, 10:18 AM

## 2016-11-17 NOTE — Plan of Care (Signed)
Problem: Pain Managment: Goal: General experience of comfort will improve Outcome: Not Progressing Pt continues to complain of head, back and neck pain 10/10. Morphine given as ordered prn. Pt also complaining of anxiety.  Problem: Fluid Volume: Goal: Ability to maintain a balanced intake and output will improve Outcome: Not Progressing Pt states it makes him sick on his stomach to eat

## 2016-11-17 NOTE — Progress Notes (Signed)
Subjective: Patient continues to complain of headache at 10/10.  Remains on antibiotics.    Objective: Current vital signs: BP (!) 185/81   Pulse (!) 46   Temp 98.1 F (36.7 C) (Oral)   Resp 14   Ht 5\' 3"  (1.6 m)   Wt 95.9 kg (211 lb 8 oz)   SpO2 96%   BMI 37.47 kg/m  Vital signs in last 24 hours: Temp:  [98.1 F (36.7 C)-98.2 F (36.8 C)] 98.1 F (36.7 C) (06/26 0405) Pulse Rate:  [46-50] 46 (06/26 0534) Resp:  [14-16] 14 (06/26 0405) BP: (182-185)/(77-87) 185/81 (06/26 0534) SpO2:  [96 %] 96 % (06/26 0405)  Intake/Output from previous day: 06/25 0701 - 06/26 0700 In: 3193.3 [I.V.:2493.3; IV Piggyback:700] Out: 700 [Urine:700] Intake/Output this shift: Total I/O In: 450 [IV Piggyback:450] Out: 1150 [Urine:1150] Nutritional status: Diet Heart Room service appropriate? Yes; Fluid consistency: Thin  Neurologic Exam: Mental Status: Alert, oriented, thought content appropriate.  Speech fluent without evidence of aphasia.  Able to follow 3 step commands without difficulty. Cranial Nerves: II: Discs flat bilaterally; Visual fields grossly normal, pupils equal, round, reactive to light and accommodation III,IV, VI: ptosis not present, extra-ocular motions intact bilaterally V,VII: smile symmetric, facial light touch sensation normal bilaterally VIII: hearing normal bilaterally IX,X: gag reflex present XI: bilateral shoulder shrug XII: midline tongue extension Motor: Right :  Upper extremity   5/5                                      Left:     Upper extremity   5/5             Lower extremity   5/5                                                  Lower extremity   5/5 Tone and bulk:normal tone throughout; no atrophy noted Sensory: Pinprick and light touch intact throughout, bilaterally Deep Tendon Reflexes: 2+ and symmetric throughout   Lab Results: Basic Metabolic Panel:  Recent Labs Lab 11/10/16 2149 11/15/16 2108 11/16/16 0332 11/17/16 0452  NA 141 139 140  135  K 3.6 3.4* 3.6 3.4*  CL 107 102 108 102  CO2 27 27 25 25   GLUCOSE 105* 152* 145* 117*  BUN 15 11 11 16   CREATININE 1.02 0.97 0.91 0.77  CALCIUM 9.3 9.6 9.4 8.9    Liver Function Tests:  Recent Labs Lab 11/10/16 2149 11/15/16 2108  AST 22 36  ALT 26 24  ALKPHOS 73 69  BILITOT 0.8 0.6  PROT 8.1 8.3*  ALBUMIN 4.4 4.4    Recent Labs Lab 11/15/16 2108  LIPASE 31   No results for input(s): AMMONIA in the last 168 hours.  CBC:  Recent Labs Lab 11/10/16 2149 11/15/16 2108 11/16/16 0332 11/17/16 0452  WBC 10.3 14.4* 17.9* 25.2*  NEUTROABS  --  13.3*  --   --   HGB 15.0 15.9 15.3 16.2  HCT 42.3 46.6 44.9 46.6  MCV 90.0 90.5 92.5 90.7  PLT 294 280 275 284    Cardiac Enzymes:  Recent Labs Lab 11/10/16 2149  TROPONINI <0.03    Lipid Panel: No results for input(s): CHOL, TRIG, HDL, CHOLHDL, VLDL, LDLCALC in the last 168  hours.  CBG: No results for input(s): GLUCAP in the last 168 hours.  Microbiology: Results for orders placed or performed in visit on 11/09/11  Urine culture     Status: None   Collection Time: 11/09/11 12:45 PM  Result Value Ref Range Status   Micro Text Report   Final       SOURCE: CC    COMMENT                   NO GROWTH IN 36 HOURS   ANTIBIOTIC                                                        Coagulation Studies: No results for input(s): LABPROT, INR in the last 72 hours.  Imaging: Ct Head Wo Contrast  Result Date: 11/15/2016 CLINICAL DATA:  EMS pt from home with c/o headache behind his eyes, N/V and hypertension; pt come in on 6/19 for same but did not stay to be seen; says he fell asleep in the lobby and woke up feeling some better so he left; today he has had the headache since he woke at 6am; pain is worse today and increased after straining to move his bowels this am; pt says he's had N/V all day; pt currently awake and alert, talking in complete coherent sentences EXAM: CT HEAD WITHOUT CONTRAST TECHNIQUE:  Contiguous axial images were obtained from the base of the skull through the vertex without intravenous contrast. COMPARISON:  Head CT, 08/29/2015 FINDINGS: Brain: No evidence of acute infarction, hemorrhage, hydrocephalus, extra-axial collection or mass lesion/mass effect. Vascular: No hyperdense vessel or unexpected calcification. Skull: Normal. Negative for fracture or focal lesion. Sinuses/Orbits: Globes and orbits are unremarkable. There is complete opacification of the right maxillary sinus with medial bulging of the medial wall mild thickening of the remaining walls. This is unchanged from the prior CT. There is also complete opacification of the anterior middle right ethmoid air cells and hypoplastic right frontal sinus. This is also unchanged. Remaining sinuses are clear. Mastoid air cells are clear. Other: None. IMPRESSION: 1. No intracranial abnormality. 2. Right-sided sinus disease as detailed above, which is stable from the prior CT. This still could be the source of the patient's headache. No other abnormalities. Electronically Signed   By: Amie Portland M.D.   On: 11/15/2016 21:59   Mr Brain Wo Contrast  Result Date: 11/16/2016 CLINICAL DATA:  Initial evaluation for neck stiffness with tenderness. EXAM: MRI HEAD WITHOUT CONTRAST MRI CERVICAL SPINE WITHOUT AND WITH CONTRAST TECHNIQUE: Multiplanar, multiecho pulse sequences of the brain and surrounding structures, and cervical spine, to include the craniocervical junction and cervicothoracic junction, were obtained without and with intravenous contrast. CONTRAST:  20mL MULTIHANCE GADOBENATE DIMEGLUMINE 529 MG/ML IV SOLN COMPARISON:  None available. FINDINGS: MRI HEAD FINDINGS Brain: Cerebral volume within normal limits. No focal parenchymal signal abnormality identified. No significant cerebral white matter disease. No abnormal foci of restricted diffusion to suggest acute or subacute ischemia. Gray-white matter differentiation well maintained. No  encephalomalacia to suggest chronic infarction. No evidence for acute or chronic intracranial hemorrhage. No mass lesion, midline shift or mass effect. No hydrocephalus. No extra-axial fluid collection. Major dural sinuses are grossly patent. Pituitary gland suprasellar region normal. Midline structures intact and normal. Vascular: Major intracranial vascular flow voids are maintained. Skull  and upper cervical spine: Craniocervical junction normal. Visualized upper cervical spine unremarkable. Bone marrow signal intensity within normal limits. No scalp soft tissue abnormality. Sinuses/Orbits: Globes and orbital soft tissues within normal limits. Scattered mucosal thickening within the right ethmoidal air cells and right maxillary sinus. Superimposed air-fluid level, suggestive of acute sinusitis. Minimal mucosal thickening within the left maxillary sinus as well. Right frontal sinus opacified as well. No mastoid effusion. Inner ear structures grossly normal. MRI CERVICAL SPINE FINDINGS Alignment: Vertebral bodies normally aligned with preservation of the normal cervical lordosis. No listhesis. Vertebrae: Vertebral body heights well maintained. No evidence for acute or chronic fracture. Signal intensity within the vertebral body bone marrow is normal. No discrete or worrisome osseous lesions. No abnormal marrow edema. No abnormal enhancement. Cord: Signal intensity within the cervical spinal cord is normal. Posterior Fossa, vertebral arteries, paraspinal tissues: Paraspinous and prevertebral soft tissues within normal limits. No abnormal enhancement. Normal intravascular flow voids present within the vertebral arteries bilaterally. Disc levels: No significant degenerative changes seen within the cervical spine. No disc bulge or disc protrusion. No significant canal or neural foraminal stenosis. No significant facet arthrosis. IMPRESSION: 1. Normal brain MRI.  No acute intracranial process identified. 2. Normal MRI of  the cervical spine. No acute abnormality identified. No significant degenerative changes for patient age. Electronically Signed   By: Rise Mu M.D.   On: 11/16/2016 21:35   Mr Cervical Spine W Wo Contrast  Result Date: 11/16/2016 CLINICAL DATA:  Initial evaluation for neck stiffness with tenderness. EXAM: MRI HEAD WITHOUT CONTRAST MRI CERVICAL SPINE WITHOUT AND WITH CONTRAST TECHNIQUE: Multiplanar, multiecho pulse sequences of the brain and surrounding structures, and cervical spine, to include the craniocervical junction and cervicothoracic junction, were obtained without and with intravenous contrast. CONTRAST:  20mL MULTIHANCE GADOBENATE DIMEGLUMINE 529 MG/ML IV SOLN COMPARISON:  None available. FINDINGS: MRI HEAD FINDINGS Brain: Cerebral volume within normal limits. No focal parenchymal signal abnormality identified. No significant cerebral white matter disease. No abnormal foci of restricted diffusion to suggest acute or subacute ischemia. Gray-white matter differentiation well maintained. No encephalomalacia to suggest chronic infarction. No evidence for acute or chronic intracranial hemorrhage. No mass lesion, midline shift or mass effect. No hydrocephalus. No extra-axial fluid collection. Major dural sinuses are grossly patent. Pituitary gland suprasellar region normal. Midline structures intact and normal. Vascular: Major intracranial vascular flow voids are maintained. Skull and upper cervical spine: Craniocervical junction normal. Visualized upper cervical spine unremarkable. Bone marrow signal intensity within normal limits. No scalp soft tissue abnormality. Sinuses/Orbits: Globes and orbital soft tissues within normal limits. Scattered mucosal thickening within the right ethmoidal air cells and right maxillary sinus. Superimposed air-fluid level, suggestive of acute sinusitis. Minimal mucosal thickening within the left maxillary sinus as well. Right frontal sinus opacified as well. No  mastoid effusion. Inner ear structures grossly normal. MRI CERVICAL SPINE FINDINGS Alignment: Vertebral bodies normally aligned with preservation of the normal cervical lordosis. No listhesis. Vertebrae: Vertebral body heights well maintained. No evidence for acute or chronic fracture. Signal intensity within the vertebral body bone marrow is normal. No discrete or worrisome osseous lesions. No abnormal marrow edema. No abnormal enhancement. Cord: Signal intensity within the cervical spinal cord is normal. Posterior Fossa, vertebral arteries, paraspinal tissues: Paraspinous and prevertebral soft tissues within normal limits. No abnormal enhancement. Normal intravascular flow voids present within the vertebral arteries bilaterally. Disc levels: No significant degenerative changes seen within the cervical spine. No disc bulge or disc protrusion. No significant canal or neural  foraminal stenosis. No significant facet arthrosis. IMPRESSION: 1. Normal brain MRI.  No acute intracranial process identified. 2. Normal MRI of the cervical spine. No acute abnormality identified. No significant degenerative changes for patient age. Electronically Signed   By: Rise MuBenjamin  McClintock M.D.   On: 11/16/2016 21:35   Dg Hand 2 View Right  Result Date: 11/16/2016 CLINICAL DATA:  MRI clearance. History of metal in right ring finger. EXAM: RIGHT HAND - 2 VIEW COMPARISON:  No recent prior. FINDINGS: Soft tissue structures are unremarkable. Deformity noted of the middle phalanx of the right fourth digit and right fifth metacarpal. These changes are most likely posttraumatic. No acute abnormality P No metallic foreign bodies noted . IMPRESSION: No metallic foreign bodies. Electronically Signed   By: Maisie Fushomas  Register   On: 11/16/2016 10:51   Dg Abd 2 Views  Result Date: 11/17/2016 CLINICAL DATA:  Back pain without nausea vomiting or diarrhea. History of kidney stones. EXAM: ABDOMEN - 2 VIEW COMPARISON:  CT scan of the abdomen and  pelvis of October 28, 2016 FINDINGS: No abnormal calcifications are observed overlying the left kidney. On the right a known approximately 1 mm diameter lower pole stone seen on the previous CT scan is not clearly evident on this plain a graphic series. The partially distended urinary bladder is normal. No ureteral stones are observed. The bowel gas pattern is within the limits of normal. The bony structures are unremarkable. The lung bases are clear. IMPRESSION: A known punctate lower pole stone is not clearly evident on today's study. No other stones are observed either. Electronically Signed   By: David  SwazilandJordan M.D.   On: 11/17/2016 09:24   Ct Renal Stone Study  Result Date: 11/17/2016 CLINICAL DATA:  Hematuria and abdominal pain EXAM: CT ABDOMEN AND PELVIS WITHOUT CONTRAST TECHNIQUE: Multidetector CT imaging of the abdomen and pelvis was performed following the standard protocol without IV contrast. COMPARISON:  06/30/2016, 11/17/2016 FINDINGS: Lower chest: No acute abnormality. Hepatobiliary: No focal liver abnormality is seen. No gallstones, gallbladder wall thickening, or biliary dilatation. Pancreas: Unremarkable. No pancreatic ductal dilatation or surrounding inflammatory changes. Spleen: Normal in size without focal abnormality. Adrenals/Urinary Tract: The adrenal glands are within normal limits. Kidneys are well visualized bilaterally. A tiny right renal stone is noted in the lower pole stable from the prior exam. The previously seen lower pole left renal stone is not well appreciated on this exam. A few small cysts are noted. The bladder is well distended. No obstructive changes are noted. Stomach/Bowel: Diverticular change of the colon is noted. No findings to suggest diverticulitis are seen. Vascular/Lymphatic: Aortic atherosclerosis. No enlarged abdominal or pelvic lymph nodes. Reproductive: Prostate is unremarkable. Other: No abdominal wall hernia or abnormality. No abdominopelvic ascites.  Musculoskeletal: No acute or significant osseous findings. IMPRESSION: Tiny right renal stone is again seen and stable in the lower pole. Previously seen left lower pole renal stone is no longer identified and has likely passed. Chronic changes as described above. Electronically Signed   By: Alcide CleverMark  Lukens M.D.   On: 11/17/2016 09:37    Medications:  I have reviewed the patient's current medications. Scheduled: . amLODipine  10 mg Oral Daily  . enoxaparin (LOVENOX) injection  40 mg Subcutaneous Q24H  . hydrochlorothiazide  25 mg Oral Daily  . ketorolac  30 mg Intravenous Once    Assessment/Plan: Patient unchanged.  On Merrem, Vancomycin and Doxy.  Reports that only Dilaudid works for pain.  Has other abdominal complaints as well.  Lab  work pending.  MRI of the brain reviewed and shows no acute changes.    Recommendations: 1.  Would continue antibiotics since patient has not been agreeable to definitive studies.     LOS: 2 days   Thana Farr, MD Neurology [phone] 11/17/2016  1:26 PM

## 2016-11-17 NOTE — Progress Notes (Signed)
Patient ID: Matthew Bartlett, male   DOB: November 03, 1965, 51 y.o.   MRN: [id number]  [facility] PROGRESS NOTE  Matthew Bartlett DOB: [birth year] DOA: 11/15/2016 PCP: Patient, No Pcp Per  HPI/Subjective: Called earlier about fianc being there and wanting to talk with me about transfer. After talking with the patient and fiance he is okay with staying here. He wants to have his pain controlled. Dye loaded is the only thing that helps his pain. Still having headache. Still nausea and vomiting and not eating. Not feeling well. Having pain in the back. Having blood in the urine.  Objective: Vitals:   11/17/16 0405 11/17/16 0534  BP: (!) 182/87 (!) 185/81  Pulse: (!) 50 (!) 46  Resp: 14   Temp: 98.1 F (36.7 C)     Filed Weights   11/15/16 2104 11/16/16 0141  Weight: 108.9 kg (240 lb) 95.9 kg (211 lb 8 oz)    ROS: Review of Systems  Constitutional: Positive for fever. Negative for chills.  Eyes: Negative for blurred vision.  Respiratory: Negative for cough and shortness of breath.   Cardiovascular: Negative for chest pain.  Gastrointestinal: Positive for nausea and vomiting. Negative for abdominal pain, constipation and diarrhea.  Genitourinary: Negative for dysuria.  Musculoskeletal: Positive for back pain, joint pain, myalgias and neck pain.  Neurological: Positive for dizziness and headaches.   Exam: Physical Exam  Constitutional: He is oriented to person, place, and time.  HENT:  Nose: No mucosal edema.  Mouth/Throat: No oropharyngeal exudate or posterior oropharyngeal edema.  Eyes: Conjunctivae, EOM and lids are normal. Pupils are equal, round, and reactive to light.  Neck: No JVD present. Carotid bruit is not present. No edema present. No thyroid mass and no thyromegaly present.  Cardiovascular: S1 normal and S2 normal.  Exam reveals no gallop.   Murmur heard.  Systolic murmur is present with a grade of 2/6  Pulses:      Dorsalis pedis pulses are 2+ on  the right side, and 2+ on the left side.  Respiratory: No respiratory distress. He has decreased breath sounds in the left lower field. He has no wheezes. He has no rhonchi. He has no rales.  GI: Soft. Bowel sounds are normal. There is no tenderness.  Musculoskeletal:       Right ankle: He exhibits no swelling.       Left ankle: He exhibits no swelling.  Lymphadenopathy:    He has no cervical adenopathy.  Neurological: He is alert and oriented to person, place, and time. No cranial nerve deficit.  Patient able to straight leg raise bilaterally without an issue. Negative Kernig sign. Negative Brudzinski sign.  Skin: Skin is warm. No rash noted. Nails show no clubbing.  Psychiatric: He has a normal mood and affect.      Data Reviewed: Basic Metabolic Panel:  Recent Labs Lab 11/10/16 2149 11/15/16 2108 11/16/16 0332 11/17/16 0452  NA 141 139 140 135  K 3.6 3.4* 3.6 3.4*  CL 107 102 108 102  CO2 27 27 25 25   GLUCOSE 105* 152* 145* 117*  BUN 15 11 11 16   CREATININE 1.02 0.97 0.91 0.77  CALCIUM 9.3 9.6 9.4 8.9   Liver Function Tests:  Recent Labs Lab 11/10/16 2149 11/15/16 2108  AST 22 36  ALT 26 24  ALKPHOS 73 69  BILITOT 0.8 0.6  PROT 8.1 8.3*  ALBUMIN 4.4 4.4    Recent Labs Lab 11/15/16 2108  LIPASE 31   CBC:  Recent Labs Lab 11/10/16 2149 11/15/16 2108 11/16/16 0332 11/17/16 0452  WBC 10.3 14.4* 17.9* 25.2*  NEUTROABS  --  13.3*  --   --   HGB 15.0 15.9 15.3 16.2  HCT 42.3 46.6 44.9 46.6  MCV 90.0 90.5 92.5 90.7  PLT 294 280 275 284   Cardiac Enzymes:  Recent Labs Lab 11/10/16 2149  TROPONINI <0.03    Studies: Ct Head Wo Contrast  Result Date: 11/15/2016 CLINICAL DATA:  EMS pt from home with c/o headache behind his eyes, N/V and hypertension; pt come in on 6/19 for same but did not stay to be seen; says he fell asleep in the lobby and woke up feeling some better so he left; today he has had the headache since he woke at 6am; pain is worse  today and increased after straining to move his bowels this am; pt says he's had N/V all day; pt currently awake and alert, talking in complete coherent sentences EXAM: CT HEAD WITHOUT CONTRAST TECHNIQUE: Contiguous axial images were obtained from the base of the skull through the vertex without intravenous contrast. COMPARISON:  Head CT, 08/29/2015 FINDINGS: Brain: No evidence of acute infarction, hemorrhage, hydrocephalus, extra-axial collection or mass lesion/mass effect. Vascular: No hyperdense vessel or unexpected calcification. Skull: Normal. Negative for fracture or focal lesion. Sinuses/Orbits: Globes and orbits are unremarkable. There is complete opacification of the right maxillary sinus with medial bulging of the medial wall mild thickening of the remaining walls. This is unchanged from the prior CT. There is also complete opacification of the anterior middle right ethmoid air cells and hypoplastic right frontal sinus. This is also unchanged. Remaining sinuses are clear. Mastoid air cells are clear. Other: None. IMPRESSION: 1. No intracranial abnormality. 2. Right-sided sinus disease as detailed above, which is stable from the prior CT. This still could be the source of the patient's headache. No other abnormalities. Electronically Signed   By: Amie Portlandavid  Ormond M.D.   On: 11/15/2016 21:59   Mr Brain Wo Contrast  Result Date: 11/16/2016 CLINICAL DATA:  Initial evaluation for neck stiffness with tenderness. EXAM: MRI HEAD WITHOUT CONTRAST MRI CERVICAL SPINE WITHOUT AND WITH CONTRAST TECHNIQUE: Multiplanar, multiecho pulse sequences of the brain and surrounding structures, and cervical spine, to include the craniocervical junction and cervicothoracic junction, were obtained without and with intravenous contrast. CONTRAST:  20mL MULTIHANCE GADOBENATE DIMEGLUMINE 529 MG/ML IV SOLN COMPARISON:  None available. FINDINGS: MRI HEAD FINDINGS Brain: Cerebral volume within normal limits. No focal parenchymal signal  abnormality identified. No significant cerebral white matter disease. No abnormal foci of restricted diffusion to suggest acute or subacute ischemia. Gray-white matter differentiation well maintained. No encephalomalacia to suggest chronic infarction. No evidence for acute or chronic intracranial hemorrhage. No mass lesion, midline shift or mass effect. No hydrocephalus. No extra-axial fluid collection. Major dural sinuses are grossly patent. Pituitary gland suprasellar region normal. Midline structures intact and normal. Vascular: Major intracranial vascular flow voids are maintained. Skull and upper cervical spine: Craniocervical junction normal. Visualized upper cervical spine unremarkable. Bone marrow signal intensity within normal limits. No scalp soft tissue abnormality. Sinuses/Orbits: Globes and orbital soft tissues within normal limits. Scattered mucosal thickening within the right ethmoidal air cells and right maxillary sinus. Superimposed air-fluid level, suggestive of acute sinusitis. Minimal mucosal thickening within the left maxillary sinus as well. Right frontal sinus opacified as well. No mastoid effusion. Inner ear structures grossly normal. MRI CERVICAL SPINE FINDINGS Alignment: Vertebral bodies normally aligned with preservation of the normal cervical lordosis.  No listhesis. Vertebrae: Vertebral body heights well maintained. No evidence for acute or chronic fracture. Signal intensity within the vertebral body bone marrow is normal. No discrete or worrisome osseous lesions. No abnormal marrow edema. No abnormal enhancement. Cord: Signal intensity within the cervical spinal cord is normal. Posterior Fossa, vertebral arteries, paraspinal tissues: Paraspinous and prevertebral soft tissues within normal limits. No abnormal enhancement. Normal intravascular flow voids present within the vertebral arteries bilaterally. Disc levels: No significant degenerative changes seen within the cervical spine. No  disc bulge or disc protrusion. No significant canal or neural foraminal stenosis. No significant facet arthrosis. IMPRESSION: 1. Normal brain MRI.  No acute intracranial process identified. 2. Normal MRI of the cervical spine. No acute abnormality identified. No significant degenerative changes for patient age. Electronically Signed   By: Rise Mu M.D.   On: 11/16/2016 21:35   Mr Cervical Spine W Wo Contrast  Result Date: 11/16/2016 CLINICAL DATA:  Initial evaluation for neck stiffness with tenderness. EXAM: MRI HEAD WITHOUT CONTRAST MRI CERVICAL SPINE WITHOUT AND WITH CONTRAST TECHNIQUE: Multiplanar, multiecho pulse sequences of the brain and surrounding structures, and cervical spine, to include the craniocervical junction and cervicothoracic junction, were obtained without and with intravenous contrast. CONTRAST:  20mL MULTIHANCE GADOBENATE DIMEGLUMINE 529 MG/ML IV SOLN COMPARISON:  None available. FINDINGS: MRI HEAD FINDINGS Brain: Cerebral volume within normal limits. No focal parenchymal signal abnormality identified. No significant cerebral white matter disease. No abnormal foci of restricted diffusion to suggest acute or subacute ischemia. Gray-white matter differentiation well maintained. No encephalomalacia to suggest chronic infarction. No evidence for acute or chronic intracranial hemorrhage. No mass lesion, midline shift or mass effect. No hydrocephalus. No extra-axial fluid collection. Major dural sinuses are grossly patent. Pituitary gland suprasellar region normal. Midline structures intact and normal. Vascular: Major intracranial vascular flow voids are maintained. Skull and upper cervical spine: Craniocervical junction normal. Visualized upper cervical spine unremarkable. Bone marrow signal intensity within normal limits. No scalp soft tissue abnormality. Sinuses/Orbits: Globes and orbital soft tissues within normal limits. Scattered mucosal thickening within the right ethmoidal air  cells and right maxillary sinus. Superimposed air-fluid level, suggestive of acute sinusitis. Minimal mucosal thickening within the left maxillary sinus as well. Right frontal sinus opacified as well. No mastoid effusion. Inner ear structures grossly normal. MRI CERVICAL SPINE FINDINGS Alignment: Vertebral bodies normally aligned with preservation of the normal cervical lordosis. No listhesis. Vertebrae: Vertebral body heights well maintained. No evidence for acute or chronic fracture. Signal intensity within the vertebral body bone marrow is normal. No discrete or worrisome osseous lesions. No abnormal marrow edema. No abnormal enhancement. Cord: Signal intensity within the cervical spinal cord is normal. Posterior Fossa, vertebral arteries, paraspinal tissues: Paraspinous and prevertebral soft tissues within normal limits. No abnormal enhancement. Normal intravascular flow voids present within the vertebral arteries bilaterally. Disc levels: No significant degenerative changes seen within the cervical spine. No disc bulge or disc protrusion. No significant canal or neural foraminal stenosis. No significant facet arthrosis. IMPRESSION: 1. Normal brain MRI.  No acute intracranial process identified. 2. Normal MRI of the cervical spine. No acute abnormality identified. No significant degenerative changes for patient age. Electronically Signed   By: Rise Mu M.D.   On: 11/16/2016 21:35   Dg Hand 2 View Right  Result Date: 11/16/2016 CLINICAL DATA:  MRI clearance. History of metal in right ring finger. EXAM: RIGHT HAND - 2 VIEW COMPARISON:  No recent prior. FINDINGS: Soft tissue structures are unremarkable. Deformity noted of the  middle phalanx of the right fourth digit and right fifth metacarpal. These changes are most likely posttraumatic. No acute abnormality P No metallic foreign bodies noted . IMPRESSION: No metallic foreign bodies. Electronically Signed   By: Maisie Fus  Register   On: 11/16/2016  10:51   Dg Abd 2 Views  Result Date: 11/17/2016 CLINICAL DATA:  Back pain without nausea vomiting or diarrhea. History of kidney stones. EXAM: ABDOMEN - 2 VIEW COMPARISON:  CT scan of the abdomen and pelvis of October 28, 2016 FINDINGS: No abnormal calcifications are observed overlying the left kidney. On the right a known approximately 1 mm diameter lower pole stone seen on the previous CT scan is not clearly evident on this plain a graphic series. The partially distended urinary bladder is normal. No ureteral stones are observed. The bowel gas pattern is within the limits of normal. The bony structures are unremarkable. The lung bases are clear. IMPRESSION: A known punctate lower pole stone is not clearly evident on today's study. No other stones are observed either. Electronically Signed   By: David  [place] M.D.   On: 11/17/2016 09:24   Ct Renal Stone Study  Result Date: 11/17/2016 CLINICAL DATA:  Hematuria and abdominal pain EXAM: CT ABDOMEN AND PELVIS WITHOUT CONTRAST TECHNIQUE: Multidetector CT imaging of the abdomen and pelvis was performed following the standard protocol without IV contrast. COMPARISON:  06/30/2016, 11/17/2016 FINDINGS: Lower chest: No acute abnormality. Hepatobiliary: No focal liver abnormality is seen. No gallstones, gallbladder wall thickening, or biliary dilatation. Pancreas: Unremarkable. No pancreatic ductal dilatation or surrounding inflammatory changes. Spleen: Normal in size without focal abnormality. Adrenals/Urinary Tract: The adrenal glands are within normal limits. Kidneys are well visualized bilaterally. A tiny right renal stone is noted in the lower pole stable from the prior exam. The previously seen lower pole left renal stone is not well appreciated on this exam. A few small cysts are noted. The bladder is well distended. No obstructive changes are noted. Stomach/Bowel: Diverticular change of the colon is noted. No findings to suggest diverticulitis are seen.  Vascular/Lymphatic: Aortic atherosclerosis. No enlarged abdominal or pelvic lymph nodes. Reproductive: Prostate is unremarkable. Other: No abdominal wall hernia or abnormality. No abdominopelvic ascites. Musculoskeletal: No acute or significant osseous findings. IMPRESSION: Tiny right renal stone is again seen and stable in the lower pole. Previously seen left lower pole renal stone is no longer identified and has likely passed. Chronic changes as described above. Electronically Signed   By: Alcide Clever M.D.   On: 11/17/2016 09:37    Scheduled Meds: . amLODipine  10 mg Oral Daily  . enoxaparin (LOVENOX) injection  40 mg Subcutaneous Q24H  . hydrochlorothiazide  25 mg Oral Daily  .  HYDROmorphone (DILAUDID) injection  0.5 mg Intravenous Once  . ketorolac  30 mg Intravenous Once   Continuous Infusions: . sodium chloride 100 mL/hr at 11/17/16 0751  . cefTRIAXone (ROCEPHIN)  IV Stopped (11/16/16 2230)  . doxycycline (VIBRAMYCIN) IV Stopped (11/17/16 1248)  . vancomycin Stopped (11/17/16 1610)    Assessment/Plan:  1. Clinical sepsis with suspected meningitis, leukocytosis, fever. Continue Rocephin and vancomycin. I added doxycycline to cover atypicals. The Patient again refused lumbar puncture making it difficult to figure out what is going on. 2. Severe headache and neck pain. MRI of the brain and cervical spine negative. One dose of IV Dilaudid. Oral Dilaudid for pain. Trial of Fiorecet. Of note the patient did speak with neurology and he talked about pain medications when they were not even  talking about this. I have a feeling that I'm  going toave a difficult time trying to control his pain. 3. Essential hypertension on Norvasc and add hydrochlorothiazide 4. Hypokalemia replace potassium 5. Impaired fasting glucose. Patient not a diabetic 6. CT renal stone study shows small stone nonobstructing.  Code Status:     Code Status Orders        Start     Ordered   11/16/16 0126  Full code   Continuous     11/16/16 0125    Code Status History    Date Active Date Inactive Code Status Order ID Comments User Context   08/29/2015 12:17 PM 08/30/2015  7:46 PM Full Code [id number]  Altamese Dilling, MD Inpatient   08/27/2015  9:03 PM 08/28/2015  4:55 PM Full Code [id number]  Shaune Pollack, MD Inpatient      Disposition Plan: To be determined based on clinical course  Consultants:  Neurology  Antibiotics:  Rocephin  Vancomycin  Doxycycline  Time spent: 30 minutes  Alford Highland  [employer]

## 2016-11-17 NOTE — Progress Notes (Signed)
      INFECTIOUS DISEASE ATTENDING ADDENDUM:   Date: 11/17/2016  Patient name: Assunta CurtisCharles L Ngo  Medical record number: [id number]  Date of birth: [birth year]   I am documenting the fact that I received a phone call this am for an Infectious Disease Consult.  OUR group [facility] but provide telephonic advise to physicians at [facility] as we do to [facility].   I had asked the person calling me to have the physician asking for the consult to please page me to discuss.  My pager is [phone].   I cannot provide proper advise without physician to physician discussion of the case over the phone.  I understand there is suspicion of meningitis but the patient is refusing LP which is necessary to make the diagnosis.  I would STRONGLY recommend LP.  One could also get MRI with contrast to look for leptomeningeal enhancement but LP is what the pt needs if this is what is suspected.  If LP obtained would send for  CSF cell count and differential  Protein and glucose  CSF GS and culture  HSV 1, 2 PCR  Save remaining CSF  I will order a PCT as it is typically elevated with bacterial meningitis but not viral meningitis  I will also order an RPR      Paulette Blanchornelius Van Dam 11/17/2016, 4:16 PM

## 2016-11-17 NOTE — Progress Notes (Addendum)
Pharmacy Antibiotic Note  Matthew Bartlett is a 51 y.o. male admitted on 11/15/2016 with suspected meningitis.  Pharmacy has been consulted for vancomycin and ceftriaxone dosing.  Plan: VT = 15 mcg/mL is therapeutic. Continue current regimen of vancomycin 1000 mg IV q8h. Patient is at risk for accumulation due to obesity - continue to follow renal function and determine when to recheck VT.  Continue ceftriaxone 2 g IV q12h  Height: 5\' 3"  (160 cm) Weight: 211 lb 8 oz (95.9 kg) IBW/kg (Calculated) : 56.9  Temp (24hrs), Avg:98.4 F (36.9 C), Min:98.1 F (36.7 C), Max:98.8 F (37.1 C)   Recent Labs Lab 11/10/16 2149 11/15/16 2108 11/16/16 0332 11/17/16 0452 11/17/16 1415  WBC 10.3 14.4* 17.9* 25.2*  --   CREATININE 1.02 0.97 0.91 0.77  --   VANCOTROUGH  --   --  13*  --  15    Estimated Creatinine Clearance: 110.8 mL/min (by C-G formula based on SCr of 0.77 mg/dL).    Allergies  Allergen Reactions  . Penicillins Hives, Nausea And Vomiting and Other (See Comments)    Has patient had a PCN reaction causing immediate rash, facial/tongue/throat swelling, SOB or lightheadedness with hypotension: No Has patient had a PCN reaction causing severe rash involving mucus membranes or skin necrosis: No Has patient had a PCN reaction that required hospitalization No Has patient had a PCN reaction occurring within the last 10 years: Yes If all of the above answers are "NO", then may proceed with Cephalosporin use.    Thank you for allowing pharmacy to be a part of this patient's care.  Cindi CarbonMary M Kaylanie Capili, PharmD, BCPS Clinical Pharmacist 11/17/2016

## 2016-11-17 NOTE — Progress Notes (Signed)
Pt refusing to wear telemetry monitor. MD notified would not d/c order. Pt still refusing to wear.

## 2016-11-18 LAB — CBC
HCT: 51.5 % (ref 40.0–52.0)
HEMOGLOBIN: 17.8 g/dL (ref 13.0–18.0)
MCH: 31.4 pg (ref 26.0–34.0)
MCHC: 34.5 g/dL (ref 32.0–36.0)
MCV: 90.9 fL (ref 80.0–100.0)
Platelets: 283 10*3/uL (ref 150–440)
RBC: 5.67 MIL/uL (ref 4.40–5.90)
RDW: 13.1 % (ref 11.5–14.5)
WBC: 17.1 10*3/uL — ABNORMAL HIGH (ref 3.8–10.6)

## 2016-11-18 LAB — BASIC METABOLIC PANEL
Anion gap: 8 (ref 5–15)
BUN: 18 mg/dL (ref 6–20)
CHLORIDE: 101 mmol/L (ref 101–111)
CO2: 26 mmol/L (ref 22–32)
CREATININE: 0.88 mg/dL (ref 0.61–1.24)
Calcium: 9 mg/dL (ref 8.9–10.3)
GFR calc Af Amer: >60 mL/min (ref >60)
GFR calc non Af Amer: >60 mL/min (ref >60)
GLUCOSE: 118 mg/dL — AB (ref 65–99)
POTASSIUM: 3.5 mmol/L (ref 3.5–5.1)
SODIUM: 135 mmol/L (ref 135–145)

## 2016-11-18 LAB — PORPHOBILINOGEN, RANDOM URINE: Quantitative Porphobilinogen: 0.5 mg/L (ref 0.0–2.0)

## 2016-11-18 MED ORDER — HYDROCHLOROTHIAZIDE 25 MG PO TABS: 25.0000 mg | ORAL_TABLET | Freq: Every day | ORAL | 0 refills | Status: DC

## 2016-11-18 MED ORDER — DOXYCYCLINE HYCLATE 100 MG PO TABS
100.0000 mg | ORAL_TABLET | Freq: Two times a day (BID) | ORAL | Status: DC
  Filled 2016-11-18: qty 1

## 2016-11-18 MED ORDER — DOXYCYCLINE HYCLATE 100 MG PO TABS: 100.0000 mg | ORAL_TABLET | Freq: Two times a day (BID) | ORAL | 0 refills | Status: DC

## 2016-11-18 MED ORDER — OXYCODONE HCL 5 MG PO TABS: 5.0000 mg | ORAL_TABLET | Freq: Four times a day (QID) | ORAL | Status: DC | PRN

## 2016-11-18 MED ORDER — OXYCODONE HCL 5 MG PO TABS: 5.0000 mg | ORAL_TABLET | Freq: Four times a day (QID) | ORAL | 0 refills | Status: DC | PRN

## 2016-11-18 MED ORDER — POTASSIUM CHLORIDE CRYS ER 20 MEQ PO TBCR
40.0000 meq | EXTENDED_RELEASE_TABLET | Freq: Once | ORAL | Status: AC
  Administered 2016-11-18: 09:00:00 40 meq via ORAL
  Filled 2016-11-18: qty 2

## 2016-11-18 MED ORDER — AMLODIPINE BESYLATE 10 MG PO TABS: 10.0000 mg | ORAL_TABLET | Freq: Every day | ORAL | 0 refills | Status: DC

## 2016-11-18 NOTE — Progress Notes (Signed)
Discharge paperwork reviewed with patient including new medications and medications changes. Pt verbalized understanding. Prescriptions attached. Patient is stable and ready for discharge. Family to transport patient home.

## 2016-11-18 NOTE — Discharge Summary (Signed)
[facility] - [place] at [facility]   PATIENT NAME: Matthew Bartlett    MR#:  [id number]  DATE OF BIRTH:  1965-09-17  DATE OF ADMISSION:  11/15/2016 ADMITTING PHYSICIAN: Jon Gills Hugelmeyer, DO  DATE OF DISCHARGE: 11/18/2016 11:54 AM  PRIMARY CARE PHYSICIAN: Patient, No Pcp Per    ADMISSION DIAGNOSIS:  Meningitis [G03.9] Frontal headache [R51] Hypertension, unspecified type [I10]  DISCHARGE DIAGNOSIS:  Active Problems:   Intractable headache   SECONDARY DIAGNOSIS:   Past Medical History:  Diagnosis Date  . Kidney stones     HOSPITAL COURSE:   1. Clinical sepsis with septic suspected meningitis, leukocytosis and fever. The patient was placed on Rocephin and vancomycin and I added doxycycline to cover atypicals. The patient refused lumbar puncture making it difficult to figure out what was going on. The patient had improved tremendously and his headache was gone and wanted to go home. I sent the patient home on a course of doxycycline. Also unfortunately no cultures were sent from the ER. 2. Severe headache and neck pain. MRI of the brain and cervical spine was negative. I did give some pain medication. I did prescribe a few fills of oxycodone upon going home 3. Essential hypertension on Norvasc and Hytrin or thiazide 4. Hypokalemia replace potassium during the hospital course 5. Impaired fasting glucose. The patient is not a diabetic 6. CT renal stone study showed a small nonobstructing stone on the right side  DISCHARGE CONDITIONS:   Satisfactory  CONSULTS OBTAINED:  Treatment Team:  Thana Farr, MD Daiva Eves, Lisette Grinder, MD  DRUG ALLERGIES:   Allergies  Allergen Reactions  . Penicillins Hives, Nausea And Vomiting and Other (See Comments)    Has patient had a PCN reaction causing immediate rash, facial/tongue/throat swelling, SOB or lightheadedness with hypotension: No Has patient had a PCN reaction causing severe rash involving mucus membranes or  skin necrosis: No Has patient had a PCN reaction that required hospitalization No Has patient had a PCN reaction occurring within the last 10 years: Yes If all of the above answers are "NO", then may proceed with Cephalosporin use.    DISCHARGE MEDICATIONS:   Discharge Medication List as of 11/18/2016 11:16 AM    START taking these medications   Details  doxycycline (VIBRA-TABS) 100 MG tablet Take 1 tablet (100 mg total) by mouth every 12 (twelve) hours., Starting Wed 11/18/2016, Print    hydrochlorothiazide (HYDRODIURIL) 25 MG tablet Take 1 tablet (25 mg total) by mouth daily., Starting Thu 11/19/2016, Print    oxyCODONE (OXY IR/ROXICODONE) 5 MG immediate release tablet Take 1 tablet (5 mg total) by mouth every 6 (six) hours as needed for severe pain., Starting Wed 11/18/2016, Print      CONTINUE these medications which have CHANGED   Details  amLODipine (NORVASC) 10 MG tablet Take 1 tablet (10 mg total) by mouth daily., Starting Thu 11/19/2016, Print      STOP taking these medications     naproxen (NAPROSYN) 500 MG tablet          DISCHARGE INSTRUCTIONS:   Follow-up with medical doctor 1-2 weeks upon discharge  If you experience worsening of your admission symptoms, develop shortness of breath, life threatening emergency, suicidal or homicidal thoughts you must seek medical attention immediately by calling 911 or calling your MD immediately  if symptoms less severe.  You Must read complete instructions/literature along with all the possible adverse reactions/side effects for all the Medicines you take and that have been prescribed to  you. Take any new Medicines after you have completely understood and accept all the possible adverse reactions/side effects.   Please note  You were cared for by a hospitalist during your hospital stay. If you have any questions about your discharge medications or the care you received while you were in the hospital after you are discharged, you  can call the unit and asked to speak with the hospitalist on call if the hospitalist that took care of you is not available. Once you are discharged, your primary care physician will handle any further medical issues. Please note that NO REFILLS for any discharge medications will be authorized once you are discharged, as it is imperative that you return to your primary care physician (or establish a relationship with a primary care physician if you do not have one) for your aftercare needs so that they can reassess your need for medications and monitor your lab values.    Today   CHIEF COMPLAINT:   Chief Complaint  Patient presents with  . Emesis  . Headache  . Hypertension    HISTORY OF PRESENT ILLNESS:  Matthew Bartlett  is a 51 y.o. male presented with nausea vomiting and headache and found to have elevated blood pressure. Admitted with suspected meningitis   VITAL SIGNS:  Blood pressure (!) 152/70, pulse 80, temperature 99.5 F (37.5 C), temperature source Oral, resp. rate 18, height 5\' 3"  (1.6 m), weight 95.9 kg (211 lb 8 oz), SpO2 98 %.    PHYSICAL EXAMINATION:  GENERAL:  51 y.o.-year-old patient lying in the bed with no acute distress.  EYES: Pupils equal, round, reactive to light and accommodation. No scleral icterus. Extraocular muscles intact.  HEENT: Head atraumatic, normocephalic. Oropharynx and nasopharynx clear.  NECK:  Supple, no jugular venous distention. No thyroid enlargement, no tenderness.  LUNGS: Normal breath sounds bilaterally, no wheezing, rales,rhonchi or crepitation. No use of accessory muscles of respiration.  CARDIOVASCULAR: S1, S2 normal. No murmurs, rubs, or gallops.  ABDOMEN: Soft, non-tender, non-distended. Bowel sounds present. No organomegaly or mass.  EXTREMITIES: No pedal edema, cyanosis, or clubbing.  NEUROLOGIC: Cranial nerves II through XII are intact. Muscle strength 5/5 in all extremities. Sensation intact. Gait not checked.  PSYCHIATRIC: The  patient is alert and oriented x 3.  SKIN: No obvious rash, lesion, or ulcer.   DATA REVIEW:   CBC  Recent Labs Lab 11/18/16 0543  WBC 17.1*  HGB 17.8  HCT 51.5  PLT 283    Chemistries   Recent Labs Lab 11/15/16 2108  11/18/16 0543  NA 139  < > 135  K 3.4*  < > 3.5  CL 102  < > 101  CO2 27  < > 26  GLUCOSE 152*  < > 118*  BUN 11  < > 18  CREATININE 0.97  < > 0.88  CALCIUM 9.6  < > 9.0  AST 36  --   --   ALT 24  --   --   ALKPHOS 69  --   --   BILITOT 0.6  --   --   < > = values in this interval not displayed.   RADIOLOGY:  Mr Brain Wo Contrast  Result Date: 11/16/2016 CLINICAL DATA:  Initial evaluation for neck stiffness with tenderness. EXAM: MRI HEAD WITHOUT CONTRAST MRI CERVICAL SPINE WITHOUT AND WITH CONTRAST TECHNIQUE: Multiplanar, multiecho pulse sequences of the brain and surrounding structures, and cervical spine, to include the craniocervical junction and cervicothoracic junction, were obtained without and with  intravenous contrast. CONTRAST:  20mL MULTIHANCE GADOBENATE DIMEGLUMINE 529 MG/ML IV SOLN COMPARISON:  None available. FINDINGS: MRI HEAD FINDINGS Brain: Cerebral volume within normal limits. No focal parenchymal signal abnormality identified. No significant cerebral white matter disease. No abnormal foci of restricted diffusion to suggest acute or subacute ischemia. Gray-white matter differentiation well maintained. No encephalomalacia to suggest chronic infarction. No evidence for acute or chronic intracranial hemorrhage. No mass lesion, midline shift or mass effect. No hydrocephalus. No extra-axial fluid collection. Major dural sinuses are grossly patent. Pituitary gland suprasellar region normal. Midline structures intact and normal. Vascular: Major intracranial vascular flow voids are maintained. Skull and upper cervical spine: Craniocervical junction normal. Visualized upper cervical spine unremarkable. Bone marrow signal intensity within normal limits. No  scalp soft tissue abnormality. Sinuses/Orbits: Globes and orbital soft tissues within normal limits. Scattered mucosal thickening within the right ethmoidal air cells and right maxillary sinus. Superimposed air-fluid level, suggestive of acute sinusitis. Minimal mucosal thickening within the left maxillary sinus as well. Right frontal sinus opacified as well. No mastoid effusion. Inner ear structures grossly normal. MRI CERVICAL SPINE FINDINGS Alignment: Vertebral bodies normally aligned with preservation of the normal cervical lordosis. No listhesis. Vertebrae: Vertebral body heights well maintained. No evidence for acute or chronic fracture. Signal intensity within the vertebral body bone marrow is normal. No discrete or worrisome osseous lesions. No abnormal marrow edema. No abnormal enhancement. Cord: Signal intensity within the cervical spinal cord is normal. Posterior Fossa, vertebral arteries, paraspinal tissues: Paraspinous and prevertebral soft tissues within normal limits. No abnormal enhancement. Normal intravascular flow voids present within the vertebral arteries bilaterally. Disc levels: No significant degenerative changes seen within the cervical spine. No disc bulge or disc protrusion. No significant canal or neural foraminal stenosis. No significant facet arthrosis. IMPRESSION: 1. Normal brain MRI.  No acute intracranial process identified. 2. Normal MRI of the cervical spine. No acute abnormality identified. No significant degenerative changes for patient age. Electronically Signed   By: Rise MuBenjamin  McClintock M.D.   On: 11/16/2016 21:35   Mr Cervical Spine W Wo Contrast  Result Date: 11/16/2016 CLINICAL DATA:  Initial evaluation for neck stiffness with tenderness. EXAM: MRI HEAD WITHOUT CONTRAST MRI CERVICAL SPINE WITHOUT AND WITH CONTRAST TECHNIQUE: Multiplanar, multiecho pulse sequences of the brain and surrounding structures, and cervical spine, to include the craniocervical junction and  cervicothoracic junction, were obtained without and with intravenous contrast. CONTRAST:  20mL MULTIHANCE GADOBENATE DIMEGLUMINE 529 MG/ML IV SOLN COMPARISON:  None available. FINDINGS: MRI HEAD FINDINGS Brain: Cerebral volume within normal limits. No focal parenchymal signal abnormality identified. No significant cerebral white matter disease. No abnormal foci of restricted diffusion to suggest acute or subacute ischemia. Gray-white matter differentiation well maintained. No encephalomalacia to suggest chronic infarction. No evidence for acute or chronic intracranial hemorrhage. No mass lesion, midline shift or mass effect. No hydrocephalus. No extra-axial fluid collection. Major dural sinuses are grossly patent. Pituitary gland suprasellar region normal. Midline structures intact and normal. Vascular: Major intracranial vascular flow voids are maintained. Skull and upper cervical spine: Craniocervical junction normal. Visualized upper cervical spine unremarkable. Bone marrow signal intensity within normal limits. No scalp soft tissue abnormality. Sinuses/Orbits: Globes and orbital soft tissues within normal limits. Scattered mucosal thickening within the right ethmoidal air cells and right maxillary sinus. Superimposed air-fluid level, suggestive of acute sinusitis. Minimal mucosal thickening within the left maxillary sinus as well. Right frontal sinus opacified as well. No mastoid effusion. Inner ear structures grossly normal. MRI CERVICAL SPINE FINDINGS Alignment:  Vertebral bodies normally aligned with preservation of the normal cervical lordosis. No listhesis. Vertebrae: Vertebral body heights well maintained. No evidence for acute or chronic fracture. Signal intensity within the vertebral body bone marrow is normal. No discrete or worrisome osseous lesions. No abnormal marrow edema. No abnormal enhancement. Cord: Signal intensity within the cervical spinal cord is normal. Posterior Fossa, vertebral arteries,  paraspinal tissues: Paraspinous and prevertebral soft tissues within normal limits. No abnormal enhancement. Normal intravascular flow voids present within the vertebral arteries bilaterally. Disc levels: No significant degenerative changes seen within the cervical spine. No disc bulge or disc protrusion. No significant canal or neural foraminal stenosis. No significant facet arthrosis. IMPRESSION: 1. Normal brain MRI.  No acute intracranial process identified. 2. Normal MRI of the cervical spine. No acute abnormality identified. No significant degenerative changes for patient age. Electronically Signed   By: Rise Mu M.D.   On: 11/16/2016 21:35   Dg Abd 2 Views  Result Date: 11/17/2016 CLINICAL DATA:  Back pain without nausea vomiting or diarrhea. History of kidney stones. EXAM: ABDOMEN - 2 VIEW COMPARISON:  CT scan of the abdomen and pelvis of October 28, 2016 FINDINGS: No abnormal calcifications are observed overlying the left kidney. On the right a known approximately 1 mm diameter lower pole stone seen on the previous CT scan is not clearly evident on this plain a graphic series. The partially distended urinary bladder is normal. No ureteral stones are observed. The bowel gas pattern is within the limits of normal. The bony structures are unremarkable. The lung bases are clear. IMPRESSION: A known punctate lower pole stone is not clearly evident on today's study. No other stones are observed either. Electronically Signed   By: David  [place] M.D.   On: 11/17/2016 09:24   Ct Renal Stone Study  Result Date: 11/17/2016 CLINICAL DATA:  Hematuria and abdominal pain EXAM: CT ABDOMEN AND PELVIS WITHOUT CONTRAST TECHNIQUE: Multidetector CT imaging of the abdomen and pelvis was performed following the standard protocol without IV contrast. COMPARISON:  06/30/2016, 11/17/2016 FINDINGS: Lower chest: No acute abnormality. Hepatobiliary: No focal liver abnormality is seen. No gallstones, gallbladder wall  thickening, or biliary dilatation. Pancreas: Unremarkable. No pancreatic ductal dilatation or surrounding inflammatory changes. Spleen: Normal in size without focal abnormality. Adrenals/Urinary Tract: The adrenal glands are within normal limits. Kidneys are well visualized bilaterally. A tiny right renal stone is noted in the lower pole stable from the prior exam. The previously seen lower pole left renal stone is not well appreciated on this exam. A few small cysts are noted. The bladder is well distended. No obstructive changes are noted. Stomach/Bowel: Diverticular change of the colon is noted. No findings to suggest diverticulitis are seen. Vascular/Lymphatic: Aortic atherosclerosis. No enlarged abdominal or pelvic lymph nodes. Reproductive: Prostate is unremarkable. Other: No abdominal wall hernia or abnormality. No abdominopelvic ascites. Musculoskeletal: No acute or significant osseous findings. IMPRESSION: Tiny right renal stone is again seen and stable in the lower pole. Previously seen left lower pole renal stone is no longer identified and has likely passed. Chronic changes as described above. Electronically Signed   By: Alcide Clever M.D.   On: 11/17/2016 09:37    Management plans discussed with the patient, and he is in agreement.  CODE STATUS:  Code Status History    Date Active Date Inactive Code Status Order ID Comments User Context   11/16/2016  1:25 AM 11/18/2016  3:31 PM Full Code [id number]  HugelmeyerJon Gills, DO Inpatient   08/29/2015  12:17 PM 08/30/2015  7:46 PM Full Code [id number]  Altamese Dilling, MD Inpatient   08/27/2015  9:03 PM 08/28/2015  4:55 PM Full Code [id number]  Shaune Pollack, MD Inpatient      TOTAL TIME TAKING CARE OF THIS PATIENT: 35 minutes.    Alford Highland M.D on 11/18/2016 at 6:14 PM  Between 7am to 6pm - Pager - [phone]  After 6pm go to [email or web address] - password [email or web address] [facility]  [facility] Office  [phone]  CC: Primary care physician; Patient,  No Pcp Per

## 2016-11-18 NOTE — Progress Notes (Signed)
Pt states he feels much better today.  No headache but stomach is "sore" requiring PO pain medication 7/10.  Pt at 80% breakfast this morning.  VSS.

## 2016-11-19 LAB — RPR: RPR: NONREACTIVE

## 2016-11-20 LAB — LYME DISEASE, WESTERN BLOT
IGG P18 AB.: ABSENT
IGG P30 AB.: ABSENT
IGG P45 AB.: ABSENT
IGG P58 AB.: ABSENT
IgG P23 Ab.: ABSENT
IgG P28 Ab.: ABSENT
IgG P39 Ab.: ABSENT
IgG P41 Ab.: ABSENT
IgG P66 Ab.: ABSENT
IgG P93 Ab.: ABSENT
IgM P23 Ab.: ABSENT
IgM P39 Ab.: ABSENT
IgM P41 Ab.: ABSENT
Lyme IgG Wb: NEGATIVE
Lyme IgM Wb: NEGATIVE

## 2017-10-11 ENCOUNTER — Emergency Department: Payer: Self-pay

## 2017-10-11 ENCOUNTER — Inpatient Hospital Stay
Admission: EM | Admit: 2017-10-11 | Discharge: 2017-10-12 | DRG: 310 | Disposition: A | Discharge status: Home / Self Care | Payer: Self-pay | Attending: Internal Medicine | Admitting: Internal Medicine

## 2017-10-11 ENCOUNTER — Other Ambulatory Visit: Payer: Self-pay

## 2017-10-11 DIAGNOSIS — K529 Noninfective gastroenteritis and colitis, unspecified: Secondary | ICD-10-CM | POA: Diagnosis present

## 2017-10-11 DIAGNOSIS — R112 Nausea with vomiting, unspecified: Secondary | ICD-10-CM

## 2017-10-11 DIAGNOSIS — R001 Bradycardia, unspecified: Principal | ICD-10-CM | POA: Diagnosis present

## 2017-10-11 DIAGNOSIS — R519 Headache, unspecified: Secondary | ICD-10-CM

## 2017-10-11 DIAGNOSIS — Z79899 Other long term (current) drug therapy: Secondary | ICD-10-CM

## 2017-10-11 DIAGNOSIS — Z880 Allergy status to penicillin: Secondary | ICD-10-CM

## 2017-10-11 DIAGNOSIS — I10 Essential (primary) hypertension: Secondary | ICD-10-CM | POA: Diagnosis present

## 2017-10-11 DIAGNOSIS — E876 Hypokalemia: Secondary | ICD-10-CM | POA: Diagnosis present

## 2017-10-11 DIAGNOSIS — E860 Dehydration: Secondary | ICD-10-CM | POA: Diagnosis present

## 2017-10-11 DIAGNOSIS — R51 Headache: Secondary | ICD-10-CM

## 2017-10-11 HISTORY — DX: Essential (primary) hypertension: I10

## 2017-10-11 LAB — URINALYSIS, ROUTINE W REFLEX MICROSCOPIC
BILIRUBIN URINE: NEGATIVE
Glucose, UA: NEGATIVE mg/dL
Hgb urine dipstick: NEGATIVE
KETONES UR: NEGATIVE mg/dL
Leukocytes, UA: NEGATIVE
NITRITE: NEGATIVE
PROTEIN: NEGATIVE mg/dL
Specific Gravity, Urine: 1.012 (ref 1.005–1.030)
pH: 8 (ref 5.0–8.0)

## 2017-10-11 LAB — COMPREHENSIVE METABOLIC PANEL
ALT: 25 U/L (ref 17–63)
AST: 28 U/L (ref 15–41)
Albumin: 3.9 g/dL (ref 3.5–5.0)
Alkaline Phosphatase: 58 U/L (ref 38–126)
Anion gap: 8 (ref 5–15)
BUN: 12 mg/dL (ref 6–20)
CHLORIDE: 107 mmol/L (ref 101–111)
CO2: 22 mmol/L (ref 22–32)
CREATININE: 0.78 mg/dL (ref 0.61–1.24)
Calcium: 8.3 mg/dL — ABNORMAL LOW (ref 8.9–10.3)
GFR calc non Af Amer: >60 mL/min (ref >60)
Glucose, Bld: 141 mg/dL — ABNORMAL HIGH (ref 65–99)
POTASSIUM: 3.9 mmol/L (ref 3.5–5.1)
SODIUM: 137 mmol/L (ref 135–145)
Total Bilirubin: 1 mg/dL (ref 0.3–1.2)
Total Protein: 7.1 g/dL (ref 6.5–8.1)

## 2017-10-11 LAB — CBC
HCT: 46 % (ref 40.0–52.0)
HEMOGLOBIN: 16.2 g/dL (ref 13.0–18.0)
MCH: 32.3 pg (ref 26.0–34.0)
MCHC: 35.2 g/dL (ref 32.0–36.0)
MCV: 91.8 fL (ref 80.0–100.0)
PLATELETS: 291 10*3/uL (ref 150–440)
RBC: 5.01 MIL/uL (ref 4.40–5.90)
RDW: 13.2 % (ref 11.5–14.5)
WBC: 14.9 10*3/uL — ABNORMAL HIGH (ref 3.8–10.6)

## 2017-10-11 LAB — URINE DRUG SCREEN, QUALITATIVE (ARMC ONLY)
AMPHETAMINES, UR SCREEN: NOT DETECTED
Barbiturates, Ur Screen: NOT DETECTED
Benzodiazepine, Ur Scrn: NOT DETECTED
CANNABINOID 50 NG, UR ~~LOC~~: POSITIVE — AB
COCAINE METABOLITE, UR ~~LOC~~: NOT DETECTED
MDMA (ECSTASY) UR SCREEN: NOT DETECTED
Methadone Scn, Ur: NOT DETECTED
Opiate, Ur Screen: POSITIVE — AB
Phencyclidine (PCP) Ur S: NOT DETECTED
TRICYCLIC, UR SCREEN: NOT DETECTED

## 2017-10-11 LAB — TROPONIN I: Troponin I: <0.03 ng/mL (ref <0.03)

## 2017-10-11 LAB — LIPASE, BLOOD: LIPASE: 20 U/L (ref 11–51)

## 2017-10-11 LAB — TSH: TSH: 1.542 u[IU]/mL (ref 0.350–4.500)

## 2017-10-11 MED ORDER — DIPHENHYDRAMINE HCL 50 MG/ML IJ SOLN
25.0000 mg | Freq: Once | INTRAMUSCULAR | Status: AC
  Administered 2017-10-11: 25 mg via INTRAVENOUS
  Filled 2017-10-11: qty 1

## 2017-10-11 MED ORDER — ATROPINE SULFATE 1 MG/10ML IJ SOSY
0.5000 mg | PREFILLED_SYRINGE | Freq: Once | INTRAMUSCULAR | Status: AC
  Administered 2017-10-11: 0.5 mg via INTRAVENOUS
  Filled 2017-10-11: qty 10

## 2017-10-11 MED ORDER — KETOROLAC TROMETHAMINE 30 MG/ML IJ SOLN
15.0000 mg | Freq: Once | INTRAMUSCULAR | Status: AC
  Administered 2017-10-11: 15 mg via INTRAVENOUS
  Filled 2017-10-11: qty 1

## 2017-10-11 MED ORDER — ACETAMINOPHEN 650 MG RE SUPP
650.0000 mg | Freq: Four times a day (QID) | RECTAL | Status: DC | PRN
Start: 2017-10-11 — End: 2017-10-12

## 2017-10-11 MED ORDER — SODIUM CHLORIDE 0.9 % IV BOLUS
1000.0000 mL | Freq: Once | INTRAVENOUS | Status: AC
  Administered 2017-10-11: 1000 mL via INTRAVENOUS

## 2017-10-11 MED ORDER — HYDRALAZINE HCL 20 MG/ML IJ SOLN
10.0000 mg | Freq: Four times a day (QID) | INTRAMUSCULAR | Status: DC | PRN
Start: 2017-10-11 — End: 2017-10-12
  Administered 2017-10-11 – 2017-10-12 (×2): 10 mg via INTRAVENOUS
  Filled 2017-10-11 (×2): qty 1

## 2017-10-11 MED ORDER — ONDANSETRON HCL 4 MG/2ML IJ SOLN: 4.0000 mg | Freq: Four times a day (QID) | INTRAMUSCULAR | Status: DC | PRN

## 2017-10-11 MED ORDER — ZOLPIDEM TARTRATE 5 MG PO TABS
5.0000 mg | ORAL_TABLET | Freq: Every evening | ORAL | Status: DC | PRN
  Administered 2017-10-11: 5 mg via ORAL
  Filled 2017-10-11: qty 1

## 2017-10-11 MED ORDER — ONDANSETRON HCL 4 MG/2ML IJ SOLN
4.0000 mg | Freq: Once | INTRAMUSCULAR | Status: AC
  Administered 2017-10-11: 4 mg via INTRAVENOUS
  Filled 2017-10-11: qty 2

## 2017-10-11 MED ORDER — ONDANSETRON HCL 4 MG/2ML IJ SOLN
4.0000 mg | Freq: Four times a day (QID) | INTRAMUSCULAR | Status: DC | PRN
  Administered 2017-10-11: 4 mg via INTRAVENOUS
  Filled 2017-10-11: qty 2

## 2017-10-11 MED ORDER — ACETAMINOPHEN 325 MG PO TABS
650.0000 mg | ORAL_TABLET | Freq: Four times a day (QID) | ORAL | Status: DC | PRN
  Administered 2017-10-11 – 2017-10-12 (×2): 650 mg via ORAL
  Filled 2017-10-11 (×2): qty 2

## 2017-10-11 MED ORDER — ONDANSETRON HCL 4 MG PO TABS: 4.0000 mg | ORAL_TABLET | Freq: Four times a day (QID) | ORAL | Status: DC | PRN

## 2017-10-11 MED ORDER — ENOXAPARIN SODIUM 40 MG/0.4ML ~~LOC~~ SOLN
40.0000 mg | SUBCUTANEOUS | Status: DC
  Administered 2017-10-11: 40 mg via SUBCUTANEOUS
  Filled 2017-10-11: qty 0.4

## 2017-10-11 MED ORDER — SODIUM CHLORIDE 0.9 % IV SOLN
INTRAVENOUS | Status: DC
  Administered 2017-10-11: 21:00:00 via INTRAVENOUS

## 2017-10-11 MED ORDER — ATROPINE SULFATE 1 MG/10ML IJ SOSY: 1.0000 mg | PREFILLED_SYRINGE | INTRAMUSCULAR | Status: DC | PRN

## 2017-10-11 MED ORDER — ACETAMINOPHEN 500 MG PO TABS
1000.0000 mg | ORAL_TABLET | Freq: Once | ORAL | Status: AC
  Administered 2017-10-11: 1000 mg via ORAL
  Filled 2017-10-11: qty 2

## 2017-10-11 NOTE — H&P (Signed)
[employer] - [place] [facility]   PATIENT NAME: Matthew Bartlett    MR#:  [id number]  DATE OF BIRTH:  07/16/1965  DATE OF ADMISSION:  10/11/2017  PRIMARY CARE PHYSICIAN: Patient, No Pcp Per   REQUESTING/REFERRING PHYSICIAN: Paduchowski MD  CHIEF COMPLAINT:   Chief Complaint  Patient presents with  . Nausea  . Emesis    HISTORY OF PRESENT ILLNESS: Matthew Bartlett  is a 52 y.o. male with a known history of HTN and kidney stones presents with nause and vomiting since this morning.  Patient also states that he is very diaphoretic since this morning.  Patient in the ER was evaluated and noticed to have significant bradycardia with heart rate in the 30s.  He had to receive atropine.  With atropine his heart rate improved.  Patient states that he is feeling much better.  However heart rate continues to be low.  He was previously taking blood pressure medications but now not on any medications currenty.  PAST MEDICAL HISTORY:   Past Medical History:  Diagnosis Date  . Hypertension   . Kidney stones     PAST SURGICAL HISTORY:  Past Surgical History:  Procedure Laterality Date  . FINGER SURGERY Right    4th digit    SOCIAL HISTORY:  Social History   Tobacco Use  . Smoking status: Never Smoker  . Smokeless tobacco: Never Used  Substance Use Topics  . Alcohol use: No    FAMILY HISTORY:  Family History  Problem Relation Age of Onset  . Hypertension Father     DRUG ALLERGIES:  Allergies  Allergen Reactions  . Penicillins Hives, Nausea And Vomiting and Other (See Comments)    Has patient had a PCN reaction causing immediate rash, facial/tongue/throat swelling, SOB or lightheadedness with hypotension: No Has patient had a PCN reaction causing severe rash involving mucus membranes or skin necrosis: No Has patient had a PCN reaction that required hospitalization No Has patient had a PCN reaction occurring within the last 10 years: Yes If all of the above  answers are "NO", then may proceed with Cephalosporin use.    REVIEW OF SYSTEMS:   CONSTITUTIONAL: No fever, positive fatigue or positive weakness.  EYES: No blurred or double vision.  EARS, NOSE, AND THROAT: No tinnitus or ear pain.  RESPIRATORY: No cough, shortness of breath, wheezing or hemoptysis.  CARDIOVASCULAR: No chest pain, orthopnea, edema.  GASTROINTESTINAL: No nausea, vomiting, diarrhea or abdominal pain.  GENITOURINARY: No dysuria, hematuria.  ENDOCRINE: No polyuria, nocturia,  HEMATOLOGY: No anemia, easy bruising or bleeding SKIN: No rash or lesion. MUSCULOSKELETAL: No joint pain or arthritis.   NEUROLOGIC: No tingling, numbness, weakness.  PSYCHIATRY: No anxiety or depression.   MEDICATIONS AT HOME:  Prior to Admission medications   Medication Sig Start Date End Date Taking? Authorizing Provider  amLODipine (NORVASC) 10 MG tablet Take 1 tablet (10 mg total) by mouth daily. 11/19/16   Alford Highland, MD  doxycycline (VIBRA-TABS) 100 MG tablet Take 1 tablet (100 mg total) by mouth every 12 (twelve) hours. 11/18/16   Alford Highland, MD  hydrochlorothiazide (HYDRODIURIL) 25 MG tablet Take 1 tablet (25 mg total) by mouth daily. 11/19/16   Alford Highland, MD  oxyCODONE (OXY IR/ROXICODONE) 5 MG immediate release tablet Take 1 tablet (5 mg total) by mouth every 6 (six) hours as needed for severe pain. 11/18/16   Alford Highland, MD      PHYSICAL EXAMINATION:   VITAL SIGNS: Blood pressure (!) 169/79, pulse 62,  temperature (!) 97.4 F (36.3 C), temperature source Oral, resp. rate (!) 25, height  (1.676 m), weight 108.9 kg (240 lb), SpO2 98 %.  GENERAL:  52 y.o.-year-old patient lying in the bed with no acute distress.  EYES: Pupils equal, round, reactive to light and accommodation. No scleral icterus. Extraocular muscles intact.  HEENT: Head atraumatic, normocephalic. Oropharynx and nasopharynx clear.  NECK:  Supple, no jugular venous distention. No thyroid  enlargement, no tenderness.  LUNGS: Normal breath sounds bilaterally, no wheezing, rales,rhonchi or crepitation. No use of accessory muscles of respiration.  CARDIOVASCULAR: S1, S2 normal. No murmurs, rubs, or gallops.  ABDOMEN: Soft, nontender, nondistended. Bowel sounds present. No organomegaly or mass.  EXTREMITIES: No pedal edema, cyanosis, or clubbing.  NEUROLOGIC: Cranial nerves II through XII are intact. Muscle strength 5/5 in all extremities. Sensation intact. Gait not checked.  PSYCHIATRIC: The patient is alert and oriented x 3.  SKIN: No obvious rash, lesion, or ulcer.   LABORATORY PANEL:   CBC Recent Labs  Lab 10/11/17 1457  WBC 14.9*  HGB 16.2  HCT 46.0  PLT 291  MCV 91.8  MCH 32.3  MCHC 35.2  RDW 13.2   ------------------------------------------------------------------------------------------------------------------  Chemistries  Recent Labs  Lab 10/11/17 1623  NA 137  K 3.9  CL 107  CO2 22  GLUCOSE 141*  BUN 12  CREATININE 0.78  CALCIUM 8.3*  AST 28  ALT 25  ALKPHOS 58  BILITOT 1.0   ------------------------------------------------------------------------------------------------------------------ estimated creatinine clearance is 123.6 mL/min (by C-G formula based on SCr of 0.78 mg/dL). ------------------------------------------------------------------------------------------------------------------ No results for input(s): TSH, T4TOTAL, T3FREE, THYROIDAB in the last 72 hours.  Invalid input(s): FREET3   Coagulation profile No results for input(s): INR, PROTIME in the last 168 hours. ------------------------------------------------------------------------------------------------------------------- No results for input(s): DDIMER in the last 72 hours. -------------------------------------------------------------------------------------------------------------------  Cardiac Enzymes Recent Labs  Lab 10/11/17 1505  TROPONINI <0.03    ------------------------------------------------------------------------------------------------------------------ Invalid input(s): POCBNP  ---------------------------------------------------------------------------------------------------------------  Urinalysis    Component Value Date/Time   COLORURINE YELLOW 11/10/2016 2149   APPEARANCEUR CLEAR 11/10/2016 2149   APPEARANCEUR Clear 01/11/2013 2011   LABSPEC >1.030 (H) 11/10/2016 2149   LABSPEC 1.018 01/11/2013 2011   PHURINE 5.5 11/10/2016 2149   GLUCOSEU NEGATIVE 11/10/2016 2149   GLUCOSEU Negative 01/11/2013 2011   HGBUR LARGE (A) 11/10/2016 2149   BILIRUBINUR NEGATIVE 11/10/2016 2149   BILIRUBINUR Negative 01/11/2013 2011   KETONESUR NEGATIVE 11/10/2016 2149   PROTEINUR TRACE (A) 11/10/2016 2149   NITRITE NEGATIVE 11/10/2016 2149   LEUKOCYTESUR NEGATIVE 11/10/2016 2149   LEUKOCYTESUR Negative 01/11/2013 2011     RADIOLOGY: Ct Head Wo Contrast  Result Date: 10/11/2017 CLINICAL DATA:  Nausea and vomiting 4-5 times since this morning. Headache. Hypertension. EXAM: CT HEAD WITHOUT CONTRAST TECHNIQUE: Contiguous axial images were obtained from the base of the skull through the vertex without intravenous contrast. COMPARISON:  11/15/2016 head CT FINDINGS: BRAIN: The ventricles and sulci are normal. No intraparenchymal hemorrhage, mass effect nor midline shift. No acute large vascular territory infarcts. Grey-white matter distinction is maintained. The basal ganglia are unremarkable. No abnormal extra-axial fluid collections. Basal cisterns are not effaced and midline. The brainstem and cerebellar hemispheres are without acute abnormalities. VASCULAR: Unremarkable. SKULL/SOFT TISSUES: No skull fracture. No significant soft tissue swelling. ORBITS/SINUSES: The included ocular globes and orbital contents are normal.The mastoid air cells are clear. The included paranasal sinuses are well-aerated. OTHER: None. IMPRESSION: Normal head CT.  Electronically Signed   By: Tollie Eth M.D.   On:  10/11/2017 16:13   Dg Chest Portable 1 View  Result Date: 10/11/2017 CLINICAL DATA:  52 year old male with hypertension, bradycardia, headache and vomiting EXAM: PORTABLE CHEST 1 VIEW COMPARISON:  Prior chest x-ray 08/27/2015 FINDINGS: The lungs are clear and negative for focal airspace consolidation, pulmonary edema or suspicious pulmonary nodule. No pleural effusion or pneumothorax. Cardiac and mediastinal contours are within normal limits. No acute fracture or lytic or blastic osseous lesions. The visualized upper abdominal bowel gas pattern is unremarkable. IMPRESSION: Negative chest x-ray. Electronically Signed   By: Malachy Moan M.D.   On: 10/11/2017 15:23    EKG: Orders placed or performed during the hospital encounter of 10/11/17  . ED EKG  . ED EKG  . EKG 12-Lead  . EKG 12-Lead  . EKG 12-Lead  . EKG 12-Lead    IMPRESSION AND PLAN: Patient is a 52 year old presenting with nausea vomiting and diaphoresis  1.  Symptomatic sinus bradycardia Etiology unclear I have discussed the case with Dr. Gwen Pounds who will see the patient in the morning We will monitor him on telemetry Use atropine for severe symptomatic bradycardia I have ordered a urine drug screen echocardiogram of the heart His bradycardia could be related to vasovagal from his nausea vomiting  2.  Leukocytosis likely due to gastroenteritis will follow CBC  3.  History of hypertension watch blood pressure currently not on any medications   4.  Miscellaneous Lovenox for DVT prophylaxis   All the records are reviewed and case discussed with ED provider. Management plans discussed with the patient, family and they are in agreement.  CODE STATUS: Code Status History    Date Active Date Inactive Code Status Order ID Comments User Context   11/16/2016 0125 11/18/2016 1531 Full Code [id number]  Tonye Royalty, DO Inpatient   08/29/2015 1217 08/30/2015 1946 Full Code  [id number]  Altamese Dilling, MD Inpatient   08/27/2015 2103 08/28/2015 1655 Full Code [id number]  Shaune Pollack, MD Inpatient       TOTAL TIME TAKING CARE OF THIS PATIENT:55 minutes.    Auburn Bilberry M.D on 10/11/2017 at 6:12 PM  Between 7am to 6pm - Pager - [phone]  After 6pm go to [email or web address] - password [email or web address] [facility]  [employer] Office  [phone]  CC: Primary care physician; Patient, No Pcp Per

## 2017-10-11 NOTE — ED Provider Notes (Signed)
[facility] Emergency Department Provider Note  Time seen: 3:29 PM  I have reviewed the triage vital signs and the nursing notes.   HISTORY  Chief Complaint Nausea and Emesis    HPI Matthew Bartlett is a 52 y.o. male with no past medical history, takes no medications, presents to the emergency department with nausea vomiting weakness and diaphoresis.  According to the patient since awakening this morning around 7:00 he has been very nauseated with frequent episodes of vomiting, has been very weak feeling diaphoretic.  Patient states this is the third or fourth episode similar to this over the past 1 year.  Denies any chest pain, abdominal pain.  Denies any recent fever cough congestion.  Denies diarrhea.  Denies dysuria.  No leg pain or swelling.  Upon arrival patient is bradycardic with heart rate around 40 bpm and moderately diaphoretic.   Past Medical History:  Diagnosis Date  . Kidney stones     Patient Active Problem List   Diagnosis Date Noted  . Intractable headache 11/15/2016  . Epigastric pain 08/29/2015  . Uncontrolled hypertension 08/29/2015  . Vomiting 08/27/2015    Past Surgical History:  Procedure Laterality Date  . FINGER SURGERY Right    4th digit    Prior to Admission medications   Medication Sig Start Date End Date Taking? Authorizing Provider  amLODipine (NORVASC) 10 MG tablet Take 1 tablet (10 mg total) by mouth daily. 11/19/16   Alford Highland, MD  doxycycline (VIBRA-TABS) 100 MG tablet Take 1 tablet (100 mg total) by mouth every 12 (twelve) hours. 11/18/16   Alford Highland, MD  hydrochlorothiazide (HYDRODIURIL) 25 MG tablet Take 1 tablet (25 mg total) by mouth daily. 11/19/16   Alford Highland, MD  oxyCODONE (OXY IR/ROXICODONE) 5 MG immediate release tablet Take 1 tablet (5 mg total) by mouth every 6 (six) hours as needed for severe pain. 11/18/16   Alford Highland, MD    Allergies  Allergen Reactions  . Penicillins Hives,  Nausea And Vomiting and Other (See Comments)    Has patient had a PCN reaction causing immediate rash, facial/tongue/throat swelling, SOB or lightheadedness with hypotension: No Has patient had a PCN reaction causing severe rash involving mucus membranes or skin necrosis: No Has patient had a PCN reaction that required hospitalization No Has patient had a PCN reaction occurring within the last 10 years: Yes If all of the above answers are "NO", then may proceed with Cephalosporin use.    Family History  Problem Relation Age of Onset  . Hypertension Father     Social History Social History   Tobacco Use  . Smoking status: Never Smoker  . Smokeless tobacco: Never Used  Substance Use Topics  . Alcohol use: No  . Drug use: Yes    Types: Marijuana    Comment: last smoked 2 days ago    Review of Systems Constitutional: Negative for fever.  Positive for generalized weakness. Eyes: Negative for visual complaints ENT: Negative for recent illness/congestion Cardiovascular: Negative for chest pain. Respiratory: Negative for shortness of breath. Gastrointestinal: Negative for abdominal pain.  Positive for nausea vomiting.  Negative for diarrhea. Genitourinary: Negative for urinary compaints Musculoskeletal: Negative for leg pain or swelling Skin: Negative for skin complaints  Neurological: Moderate headache All other ROS negative  ____________________________________________   PHYSICAL EXAM:  VITAL SIGNS: ED Triage Vitals  Enc Vitals Group     BP 10/11/17 1454 (!) 191/78     Pulse Rate 10/11/17 1454 (!) 42  Resp 10/11/17 1454 15     Temp 10/11/17 1454 (!) 97.4 F (36.3 C)     Temp Source 10/11/17 1454 Oral     SpO2 10/11/17 1454 98 %     Weight 10/11/17 1455 240 lb (108.9 kg)     Height 10/11/17 1455  (1.676 m)     Head Circumference --      Peak Flow --      Pain Score 10/11/17 1455 10     Pain Loc --      Pain Edu? --      Excl. in GC? --     Constitutional: Alert and oriented.  Mildly diaphoretic, nauseated in appearance. Eyes: Normal exam ENT   Head: Normocephalic and atraumatic.   Mouth/Throat: Mucous membranes are moist. Cardiovascular: Regular rhythm, rate around 40 bpm.  No obvious murmur, rub or gallop. Respiratory: Normal respiratory effort without tachypnea nor retractions. Breath sounds are clear  Gastrointestinal: Soft and nontender. No distention.   Musculoskeletal: Nontender with normal range of motion in all extremities. No lower extremity tenderness or edema. Neurologic:  Normal speech and language. No gross focal neurologic deficits  Skin:  Skin is warm.  Mildly diaphoretic. Psychiatric: Mood and affect are normal.   ____________________________________________    EKG  EKG reviewed and interpreted by myself shows sinus bradycardia at 42 bpm with a narrow QRS, normal axis, normal intervals, no concerning ST changes.  EKG #215: 46: 56 reviewed and interpreted by myself shows sinus rhythm at 65 bpm with a narrow QRS, normal axis, largely normal intervals with no concerning ST changes.  ____________________________________________    RADIOLOGY  Chest x-ray negative  ____________________________________________   INITIAL IMPRESSION / ASSESSMENT AND PLAN / ED COURSE  Pertinent labs & imaging results that were available during my care of the patient were reviewed by me and considered in my medical decision making (see chart for details).  Patient presents to the emergency department with bradycardia nausea vomiting diaphoresis.  Patient is bradycardic around 40 bpm but appears to have a sinus rhythm.  Differential would include sinus bradycardia, ACS, electrolyte/metabolic abnormality.  We will continue to closely monitor the patient we will check labs including cardiac enzymes.  We will dose 0.5 mg of atropine for likely symptomatic bradycardia as well as 4 mg of Zofran, IV fluids while awaiting  results.  Patient is labs have resulted largely within normal limits including a negative troponin.  Patient does have a moderate leukocytosis of 14,900.  Patient's headache is down to a 7/10 after medications.  CT scan of the head is negative, chest x-ray is normal.  Patient states he has had the similar symptoms along with headache nausea vomiting diaphoresis occurring 3-4 times over the past 1 year.  Patient symptoms have largely resolved as far as the nausea vomiting with the atropine when his heart rate was in the 70s/80s.  His heart rate has decreased once again into the 40s.  Given the patient's symptomatic bradycardia we will admit to the hospitalist service for further work-up and treatment.    ____________________________________________   FINAL CLINICAL IMPRESSION(S) / ED DIAGNOSES  Nausea Bradycardia Headache    Minna Antis, MD 10/11/17 1800

## 2017-10-11 NOTE — ED Triage Notes (Signed)
Pt arrived via ems for report of N/V - has vomited 4-5 times since this am - he is also c/o headache and has noted elevated BP with bradycardia and no hx to support either

## 2017-10-11 NOTE — ED Notes (Signed)
Attempted to call report at 1930 when protected time was over and was told that charge nurse had not assigned any nurse to that patient

## 2017-10-11 NOTE — ED Notes (Signed)
Patient transported to 237.

## 2017-10-11 NOTE — ED Notes (Addendum)
Nurse unable to give report for 2nd time. Was told that floor nurse would call back to get report

## 2017-10-11 NOTE — ED Notes (Signed)
Report given to Bill RN

## 2017-10-12 ENCOUNTER — Other Ambulatory Visit: Payer: Self-pay

## 2017-10-12 LAB — CBC
HCT: 45.1 % (ref 40.0–52.0)
HEMATOCRIT: 47 % (ref 40.0–52.0)
HEMOGLOBIN: 16.3 g/dL (ref 13.0–18.0)
HEMOGLOBIN: 15.6 g/dL (ref 13.0–18.0)
MCH: 31.8 pg (ref 26.0–34.0)
MCH: 31.4 pg (ref 26.0–34.0)
MCHC: 34.7 g/dL (ref 32.0–36.0)
MCHC: 34.5 g/dL (ref 32.0–36.0)
MCV: 91.6 fL (ref 80.0–100.0)
MCV: 91 fL (ref 80.0–100.0)
PLATELETS: 266 10*3/uL (ref 150–440)
Platelets: 304 10*3/uL (ref 150–440)
RBC: 5.13 MIL/uL (ref 4.40–5.90)
RBC: 4.95 MIL/uL (ref 4.40–5.90)
RDW: 13.4 % (ref 11.5–14.5)
RDW: 13.2 % (ref 11.5–14.5)
WBC: 22.2 10*3/uL — ABNORMAL HIGH (ref 3.8–10.6)
WBC: 19.9 10*3/uL — ABNORMAL HIGH (ref 3.8–10.6)

## 2017-10-12 LAB — BASIC METABOLIC PANEL
Anion gap: 8 (ref 5–15)
BUN: 13 mg/dL (ref 6–20)
CHLORIDE: 106 mmol/L (ref 101–111)
CO2: 22 mmol/L (ref 22–32)
CREATININE: 0.86 mg/dL (ref 0.61–1.24)
Calcium: 9 mg/dL (ref 8.9–10.3)
GFR calc non Af Amer: >60 mL/min (ref >60)
Glucose, Bld: 135 mg/dL — ABNORMAL HIGH (ref 65–99)
Potassium: 3.2 mmol/L — ABNORMAL LOW (ref 3.5–5.1)
Sodium: 136 mmol/L (ref 135–145)

## 2017-10-12 LAB — TROPONIN I
Troponin I: <0.03 ng/mL (ref <0.03)
Troponin I: <0.03 ng/mL (ref <0.03)

## 2017-10-12 MED ORDER — SODIUM CHLORIDE 0.9 % IV BOLUS
500.0000 mL | Freq: Once | INTRAVENOUS | Status: AC
  Administered 2017-10-12: 500 mL via INTRAVENOUS

## 2017-10-12 MED ORDER — POTASSIUM CHLORIDE CRYS ER 20 MEQ PO TBCR
40.0000 meq | EXTENDED_RELEASE_TABLET | Freq: Once | ORAL | Status: AC
  Administered 2017-10-12: 40 meq via ORAL
  Filled 2017-10-12: qty 2

## 2017-10-12 MED ORDER — ONDANSETRON HCL 4 MG PO TABS: 4.0000 mg | ORAL_TABLET | Freq: Three times a day (TID) | ORAL | 0 refills | Status: AC | PRN

## 2017-10-12 NOTE — Consult Note (Signed)
[facility] Cardiology Consultation Note  Patient ID: Matthew Bartlett, MRN: [id number], DOB/AGE: Jun 01, 1965 52 y.o. Admit date: 10/11/2017   Date of Consult: 10/12/2017 Primary Physician: Patient, No Pcp Per Primary Cardiologist: None  Chief Complaint:  Chief Complaint  Patient presents with  . Nausea  . Emesis   Reason for Consult: Bradycardia  HPI: 52 y.o. male with known essential hypertension and possible hyperlipidemia doing fairly well physically in the last many months with only back pain.  He has not had any congestive heart failure or anginal type symptoms in the last many months.  In the last 2 days he has had abdominal discomfort with some nausea and vomiting.  With this the patient feels weak hot and diaphoretic.  He was seen in the emergency room and had some significant bradycardia at the time down into the 30 bpm range but did have improvements with atropine and when nausea abated.  His heart rate was more's consistent with vagally mediated bradycardia rather than true sick sinus syndrome.  The patient has since been more stable with no further evidence of significant hypertension bradycardia or symptoms of abdominal discomfort.  Troponin was normal and EKG shows normal sinus rhythm.  The patient has been ambulating well  Past Medical History:  Diagnosis Date  . Hypertension   . Kidney stones       Surgical History:  Past Surgical History:  Procedure Laterality Date  . FINGER SURGERY Right    4th digit     Home Meds: Prior to Admission medications   Medication Sig Start Date End Date Taking? Authorizing Provider  amLODipine (NORVASC) 10 MG tablet Take 1 tablet (10 mg total) by mouth daily. Patient not taking: Reported on 10/11/2017 11/19/16   Alford Highland, MD  hydrochlorothiazide (HYDRODIURIL) 25 MG tablet Take 1 tablet (25 mg total) by mouth daily. Patient not taking: Reported on 10/11/2017 11/19/16   Alford Highland, MD    Inpatient Medications:  .  enoxaparin (LOVENOX) injection  40 mg Subcutaneous Q24H   . sodium chloride 50 mL/hr at 10/11/17 2054    Allergies:  Allergies  Allergen Reactions  . Penicillins Hives, Nausea And Vomiting and Other (See Comments)    Has patient had a PCN reaction causing immediate rash, facial/tongue/throat swelling, SOB or lightheadedness with hypotension: No Has patient had a PCN reaction causing severe rash involving mucus membranes or skin necrosis: No Has patient had a PCN reaction that required hospitalization No Has patient had a PCN reaction occurring within the last 10 years: Yes If all of the above answers are "NO", then may proceed with Cephalosporin use.    Social History   Socioeconomic History  . Marital status: Married    Spouse name: Not on file  . Number of children: Not on file  . Years of education: Not on file  . Highest education level: Not on file  Occupational History  . Not on file  Social Needs  . Financial resource strain: Not on file  . Food insecurity:    Worry: Not on file    Inability: Not on file  . Transportation needs:    Medical: Not on file    Non-medical: Not on file  Tobacco Use  . Smoking status: Never Smoker  . Smokeless tobacco: Never Used  Substance and Sexual Activity  . Alcohol use: No  . Drug use: Yes    Types: Marijuana    Comment: last smoked 2 days ago  . Sexual activity: Not on  file  Lifestyle  . Physical activity:    Days per week: Not on file    Minutes per session: Not on file  . Stress: Not on file  Relationships  . Social connections:    Talks on phone: Not on file    Gets together: Not on file    Attends religious service: Not on file    Active member of club or organization: Not on file    Attends meetings of clubs or organizations: Not on file    Relationship status: Not on file  . Intimate partner violence:    Fear of current or ex partner: Not on file    Emotionally abused: Not on file    Physically abused: Not on  file    Forced sexual activity: Not on file  Other Topics Concern  . Not on file  Social History Narrative  . Not on file     Family History  Problem Relation Age of Onset  . Hypertension Father      Review of Systems Positive for nausea vomiting Negative for: General:  chills, fever, night sweats or weight changes.  Cardiovascular: PND orthopnea syncope dizziness  Dermatological skin lesions rashes Respiratory: Cough congestion Urologic: Frequent urination urination at night and hematuria Abdominal: Positive for nausea, vomiting, negative for diarrhea, bright red blood per rectum, melena, or hematemesis Neurologic: negative for visual changes, and/or hearing changes  All other systems reviewed and are otherwise negative except as noted above.  Labs: Recent Labs    10/11/17 1505 10/11/17 2036 10/12/17 0215  TROPONINI <0.03 <0.03 <0.03   Lab Results  Component Value Date   WBC 22.2 (H) 10/12/2017   HGB 16.3 10/12/2017   HCT 47.0 10/12/2017   MCV 91.6 10/12/2017   PLT 304 10/12/2017    Recent Labs  Lab 10/11/17 1623  NA 137  K 3.9  CL 107  CO2 22  BUN 12  CREATININE 0.78  CALCIUM 8.3*  PROT 7.1  BILITOT 1.0  ALKPHOS 58  ALT 25  AST 28  GLUCOSE 141*   Lab Results  Component Value Date   CHOL 170 08/29/2015   HDL 34 (L) 08/29/2015   LDLCALC 109 (H) 08/29/2015   TRIG 135 08/29/2015   No results found for: DDIMER  Radiology/Studies:  Ct Head Wo Contrast  Result Date: 10/11/2017 CLINICAL DATA:  Nausea and vomiting 4-5 times since this morning. Headache. Hypertension. EXAM: CT HEAD WITHOUT CONTRAST TECHNIQUE: Contiguous axial images were obtained from the base of the skull through the vertex without intravenous contrast. COMPARISON:  11/15/2016 head CT FINDINGS: BRAIN: The ventricles and sulci are normal. No intraparenchymal hemorrhage, mass effect nor midline shift. No acute large vascular territory infarcts. Grey-white matter distinction is maintained.  The basal ganglia are unremarkable. No abnormal extra-axial fluid collections. Basal cisterns are not effaced and midline. The brainstem and cerebellar hemispheres are without acute abnormalities. VASCULAR: Unremarkable. SKULL/SOFT TISSUES: No skull fracture. No significant soft tissue swelling. ORBITS/SINUSES: The included ocular globes and orbital contents are normal.The mastoid air cells are clear. The included paranasal sinuses are well-aerated. OTHER: None. IMPRESSION: Normal head CT. Electronically Signed   By: Tollie Eth M.D.   On: 10/11/2017 16:13   Dg Chest Portable 1 View  Result Date: 10/11/2017 CLINICAL DATA:  52 year old male with hypertension, bradycardia, headache and vomiting EXAM: PORTABLE CHEST 1 VIEW COMPARISON:  Prior chest x-ray 08/27/2015 FINDINGS: The lungs are clear and negative for focal airspace consolidation, pulmonary edema or suspicious pulmonary nodule.  No pleural effusion or pneumothorax. Cardiac and mediastinal contours are within normal limits. No acute fracture or lytic or blastic osseous lesions. The visualized upper abdominal bowel gas pattern is unremarkable. IMPRESSION: Negative chest x-ray. Electronically Signed   By: Malachy Moan M.D.   On: 10/11/2017 15:23    EKG: Normal sinus rhythm  Weights: Filed Weights   10/11/17 1455 10/11/17 2019  Weight: 240 lb (108.9 kg) 225 lb (102.1 kg)     Physical Exam: Blood pressure (!) 145/84, pulse 86, temperature 98.4 F (36.9 C), temperature source Oral, resp. rate 14, height  (1.676 m), weight 225 lb (102.1 kg), SpO2 98 %. Body mass index is 36.32 kg/m. General: Well developed, well nourished, in no acute distress. Head eyes ears nose throat: Normocephalic, atraumatic, sclera non-icteric, no xanthomas, nares are without discharge. No apparent thyromegaly and/or mass  Lungs: Normal respiratory effort.  no wheezes, no rales, no rhonchi.  Heart: RRR with normal S1 S2.  Plus apical murmur gallop, no rub, PMI  is normal size and placement, carotid upstroke normal without bruit, jugular venous pressure is normal Abdomen: Soft, non-tender, non-distended with normoactive bowel sounds. No hepatomegaly. No rebound/guarding. No obvious abdominal masses. Abdominal aorta is normal size without bruit Extremities: No edema. no cyanosis, no clubbing, no ulcers  Peripheral : 2+ bilateral upper extremity pulses, 2+ bilateral femoral pulses, 2+ bilateral dorsal pedal pulse Neuro: Alert and oriented. No facial asymmetry. No focal deficit. Moves all extremities spontaneously. Musculoskeletal: Normal muscle tone without kyphosis Psych:  Responds to questions appropriately with a normal affect.    Assessment: 52 year old male with borderline hypertension hyperlipidemia having abdominal discomfort nausea and vomiting with.  Apparent vagally mediated bradycardia likely due to current illness rather than cardiac origin.  There is no current evidence of angina or congestive heart failure or myocardial infarction  Plan: 1.  No further cardiac diagnostics and treatment options necessary at this time due to patient ambulating with normal EKG telemetry and no further symptoms 2.  Further supportive care and treatment of abdominal discomfort nausea and vomiting 3.  Continue ambulation and follow further for any further issues from the cardiac standpoint 4.  Call if further questions  Signed, Lamar Blinks M.D. [facility] Cardiology 10/12/2017, 8:27 AM

## 2017-10-12 NOTE — Progress Notes (Signed)
Advanced care plan.  Purpose of the Encounter: CODE STATUS  Parties in Attendance:Patient Patient's Decision Capacity:Good Subjective/Patient's story: Presented for low heart rate Objective/Medical story Has bradycardia sinus  Goals of care determination:  Advance directives and plan of care discussed For now patient wants everything done which includes cardiac resuscitation, intubation and ventilator if need arises. CODE STATUS: Full code Time spent discussing advanced care planning: 16 minutes

## 2017-10-12 NOTE — Progress Notes (Signed)
Patient was discharged home as per order, discharge instruction provided, iv removed, tele removed , patient discharge home .

## 2017-10-12 NOTE — Plan of Care (Signed)
  Problem: Education: Goal: Knowledge of General Education information will improve Outcome: Adequate for Discharge   Problem: Clinical Measurements: Goal: Cardiovascular complication will be avoided Outcome: Adequate for Discharge   Problem: Clinical Measurements: Goal: Ability to maintain clinical measurements within normal limits will improve Outcome: Adequate for Discharge

## 2017-10-12 NOTE — Care Management (Signed)
Patient discharged home today.  Coupon provided from [email or web address] for Zofran, $8.34 out of pocket.  Provided with application for Medication Management  And ODC.

## 2017-10-12 NOTE — Discharge Summary (Signed)
[facility] [place] [facility]   PATIENT NAME: Matthew Bartlett    MR#:  [id number]  DATE OF BIRTH:  [birth year]  DATE OF ADMISSION:  10/11/2017 ADMITTING PHYSICIAN: Auburn Bilberry, MD  DATE OF DISCHARGE: 10/12/2017 12:20 PM  PRIMARY CARE PHYSICIAN: Patient, No Pcp Per   ADMISSION DIAGNOSIS:  Symptomatic bradycardia [R00.1] Acute nonintractable headache, unspecified headache type [R51] Non-intractable vomiting with nausea, unspecified vomiting type [R11.2]  DISCHARGE DIAGNOSIS:  Active Problems:   Bradycardia Nausea and vomiting Hypertension Leukocytosis  dehydration Hypokalemia  SECONDARY DIAGNOSIS:   Past Medical History:  Diagnosis Date  . Hypertension   . Kidney stones      ADMITTING HISTORY Matthew Bartlett  is a 52 y.o. male with a known history of HTN and kidney stones presents with nause and vomiting since this morning.  Patient also states that he is very diaphoretic since this morning.  Patient in the ER was evaluated and noticed to have significant bradycardia with heart rate in the 30s.  He had to receive atropine.  With atropine his heart rate improved.  Patient states that he is feeling much better.  However heart rate continues to be low.  He was previously taking blood pressure medications but now not on any medications currenty.   HOSPITAL COURSE:  Patient seen and evaluated today.  Patient was admitted to telemetry.  Telemetry monitoring uneventful.  Sinus bradycardia resolved status post cardiology evaluation.  Cardiology did not recommend any work-up.  Patient had elevated WBC count but no infectious etiology.  Chest x-ray did not reveal any pneumonia and urinalysis did not show any infection.  He was given IV fluids dehydration improved and WBC count trended down.  Nausea and vomiting also improved.  Patient's potassium was low which was supplemented prior to discharge .Patient will be discharged home follow-up with primary care physician in  the clinic.  CONSULTS OBTAINED:  Cardiology consultation  DRUG ALLERGIES:   Allergies  Allergen Reactions  . Penicillins Hives, Nausea And Vomiting and Other (See Comments)    Has patient had a PCN reaction causing immediate rash, facial/tongue/throat swelling, SOB or lightheadedness with hypotension: No Has patient had a PCN reaction causing severe rash involving mucus membranes or skin necrosis: No Has patient had a PCN reaction that required hospitalization No Has patient had a PCN reaction occurring within the last 10 years: Yes If all of the above answers are "NO", then may proceed with Cephalosporin use.    DISCHARGE MEDICATIONS:   Allergies as of 10/12/2017      Reactions   Penicillins Hives, Nausea And Vomiting, Other (See Comments)   Has patient had a PCN reaction causing immediate rash, facial/tongue/throat swelling, SOB or lightheadedness with hypotension: No Has patient had a PCN reaction causing severe rash involving mucus membranes or skin necrosis: No Has patient had a PCN reaction that required hospitalization No Has patient had a PCN reaction occurring within the last 10 years: Yes If all of the above answers are "NO", then may proceed with Cephalosporin use.      Medication List    STOP taking these medications   amLODipine 10 MG tablet Commonly known as:  NORVASC   hydrochlorothiazide 25 MG tablet Commonly known as:  HYDRODIURIL     TAKE these medications   ondansetron 4 MG tablet Commonly known as:  ZOFRAN Take 1 tablet (4 mg total) by mouth every 8 (eight) hours as needed for nausea.       Today  Patient  seen and evaluated today No chest pain No shortness of breath No palpitations  VITAL SIGNS:  Blood pressure (!) 145/84, pulse 86, temperature 98.4 F (36.9 C), temperature source Oral, resp. rate 14, height  (1.676 m), weight 102.1 kg (225 lb), SpO2 98 %.  I/O:    Intake/Output Summary (Last 24 hours) at 10/12/2017 1644 Last data  filed at 10/12/2017 0527 Gross per 24 hour  Intake 205 ml  Output 600 ml  Net -395 ml    PHYSICAL EXAMINATION:  Physical Exam  GENERAL:  52 y.o.-year-old patient lying in the bed with no acute distress.  LUNGS: Normal breath sounds bilaterally, no wheezing, rales,rhonchi or crepitation. No use of accessory muscles of respiration.  CARDIOVASCULAR: S1, S2 normal. No murmurs, rubs, or gallops.  ABDOMEN: Soft, non-tender, non-distended. Bowel sounds present. No organomegaly or mass.  NEUROLOGIC: Moves all 4 extremities. PSYCHIATRIC: The patient is alert and oriented x 3.  SKIN: No obvious rash, lesion, or ulcer.   DATA REVIEW:   CBC Recent Labs  Lab 10/12/17 1133  WBC 19.9*  HGB 15.6  HCT 45.1  PLT 266    Chemistries  Recent Labs  Lab 10/11/17 1623 10/12/17 0804  NA 137 136  K 3.9 3.2*  CL 107 106  CO2 22 22  GLUCOSE 141* 135*  BUN 12 13  CREATININE 0.78 0.86  CALCIUM 8.3* 9.0  AST 28  --   ALT 25  --   ALKPHOS 58  --   BILITOT 1.0  --     Cardiac Enzymes Recent Labs  Lab 10/12/17 0804  TROPONINI <0.03    Microbiology Results  No results found for this or any previous visit.  RADIOLOGY:  Ct Head Wo Contrast  Result Date: 10/11/2017 CLINICAL DATA:  Nausea and vomiting 4-5 times since this morning. Headache. Hypertension. EXAM: CT HEAD WITHOUT CONTRAST TECHNIQUE: Contiguous axial images were obtained from the base of the skull through the vertex without intravenous contrast. COMPARISON:  11/15/2016 head CT FINDINGS: BRAIN: The ventricles and sulci are normal. No intraparenchymal hemorrhage, mass effect nor midline shift. No acute large vascular territory infarcts. Grey-white matter distinction is maintained. The basal ganglia are unremarkable. No abnormal extra-axial fluid collections. Basal cisterns are not effaced and midline. The brainstem and cerebellar hemispheres are without acute abnormalities. VASCULAR: Unremarkable. SKULL/SOFT TISSUES: No skull  fracture. No significant soft tissue swelling. ORBITS/SINUSES: The included ocular globes and orbital contents are normal.The mastoid air cells are clear. The included paranasal sinuses are well-aerated. OTHER: None. IMPRESSION: Normal head CT. Electronically Signed   By: Tollie Eth M.D.   On: 10/11/2017 16:13   Dg Chest Portable 1 View  Result Date: 10/11/2017 CLINICAL DATA:  52 year old male with hypertension, bradycardia, headache and vomiting EXAM: PORTABLE CHEST 1 VIEW COMPARISON:  Prior chest x-ray 08/27/2015 FINDINGS: The lungs are clear and negative for focal airspace consolidation, pulmonary edema or suspicious pulmonary nodule. No pleural effusion or pneumothorax. Cardiac and mediastinal contours are within normal limits. No acute fracture or lytic or blastic osseous lesions. The visualized upper abdominal bowel gas pattern is unremarkable. IMPRESSION: Negative chest x-ray. Electronically Signed   By: Malachy Moan M.D.   On: 10/11/2017 15:23    Follow up with PCP in 1 week.  Management plans discussed with the patient, family and they are in agreement.  CODE STATUS: Full code Code Status History    Date Active Date Inactive Code Status Order ID Comments User Context   10/11/2017 2015 10/12/2017 1528  Full Code [id number]  Auburn Bilberry, MD Inpatient   11/16/2016 0125 11/18/2016 1531 Full Code [id number]  Tonye Royalty, DO Inpatient   08/29/2015 1217 08/30/2015 1946 Full Code [id number]  Altamese Dilling, MD Inpatient   08/27/2015 2103 08/28/2015 1655 Full Code [id number]  Shaune Pollack, MD Inpatient      TOTAL TIME TAKING CARE OF THIS PATIENT ON DAY OF DISCHARGE: more than 35 minutes.   Ihor Austin M.D on 10/12/2017 at 4:44 PM  Between 7am to 6pm - Pager - [phone]  After 6pm go to [email or web address] - password [email or web address]  [facility] [place] [facility]  Office  [phone]  CC: Primary care physician; Patient, No Pcp Per  Note: This dictation was prepared with Dragon  dictation along with smaller phrase technology. Any transcriptional errors that result from this process are unintentional.

## 2017-10-12 NOTE — Progress Notes (Signed)
Patient was transferred from the ED following acute onset of nausea and vomiting. On admission patient was A&O X4, had an episode of N/V and was in asymptomatic bradycardia in the 40s-50s and elevated BP.  of PRN hydralazine was administered per order from Altamese Dilling, MD. Care plan was discussed with patient. . Patient rested for most of the night with HR in the 50-70.

## 2017-10-12 NOTE — Plan of Care (Signed)

## 2017-10-14 ENCOUNTER — Emergency Department: Payer: Self-pay

## 2017-10-14 ENCOUNTER — Emergency Department
Admission: EM | Admit: 2017-10-14 | Discharge: 2017-10-15 | Disposition: A | Discharge status: Home / Self Care | Payer: Self-pay | Attending: Emergency Medicine | Admitting: Emergency Medicine

## 2017-10-14 DIAGNOSIS — R002 Palpitations: Secondary | ICD-10-CM | POA: Insufficient documentation

## 2017-10-14 DIAGNOSIS — I10 Essential (primary) hypertension: Secondary | ICD-10-CM | POA: Insufficient documentation

## 2017-10-14 DIAGNOSIS — R519 Headache, unspecified: Secondary | ICD-10-CM

## 2017-10-14 DIAGNOSIS — R51 Headache: Secondary | ICD-10-CM | POA: Insufficient documentation

## 2017-10-14 LAB — BASIC METABOLIC PANEL
Anion gap: 9 (ref 5–15)
BUN: 18 mg/dL (ref 6–20)
CO2: 27 mmol/L (ref 22–32)
CREATININE: 0.87 mg/dL (ref 0.61–1.24)
Calcium: 9.3 mg/dL (ref 8.9–10.3)
Chloride: 100 mmol/L — ABNORMAL LOW (ref 101–111)
GFR calc Af Amer: >60 mL/min (ref >60)
Glucose, Bld: 118 mg/dL — ABNORMAL HIGH (ref 65–99)
Potassium: 3.3 mmol/L — ABNORMAL LOW (ref 3.5–5.1)
SODIUM: 136 mmol/L (ref 135–145)

## 2017-10-14 LAB — CBC
HCT: 48.4 % (ref 40.0–52.0)
Hemoglobin: 16.8 g/dL (ref 13.0–18.0)
MCH: 31.6 pg (ref 26.0–34.0)
MCHC: 34.7 g/dL (ref 32.0–36.0)
MCV: 91 fL (ref 80.0–100.0)
Platelets: 294 10*3/uL (ref 150–440)
RBC: 5.32 MIL/uL (ref 4.40–5.90)
RDW: 13.4 % (ref 11.5–14.5)
WBC: 17.1 10*3/uL — AB (ref 3.8–10.6)

## 2017-10-14 LAB — LIPASE, BLOOD: LIPASE: 79 U/L — AB (ref 11–51)

## 2017-10-14 LAB — TROPONIN I: Troponin I: <0.03 ng/mL (ref <0.03)

## 2017-10-14 MED ORDER — METOCLOPRAMIDE HCL 5 MG/ML IJ SOLN
10.0000 mg | Freq: Once | INTRAMUSCULAR | Status: AC
  Administered 2017-10-14: 10 mg via INTRAVENOUS
  Filled 2017-10-14: qty 2

## 2017-10-14 MED ORDER — KETOROLAC TROMETHAMINE 30 MG/ML IJ SOLN
15.0000 mg | Freq: Once | INTRAMUSCULAR | Status: AC
  Administered 2017-10-14: 15 mg via INTRAVENOUS
  Filled 2017-10-14: qty 1

## 2017-10-14 MED ORDER — ONDANSETRON 4 MG PO TBDP
4.0000 mg | ORAL_TABLET | Freq: Once | ORAL | Status: AC
  Administered 2017-10-14: 4 mg via ORAL

## 2017-10-14 MED ORDER — ONDANSETRON 4 MG PO TBDP
ORAL_TABLET | ORAL | Status: AC
  Filled 2017-10-14: qty 1

## 2017-10-14 MED ORDER — SODIUM CHLORIDE 0.9 % IV SOLN
Freq: Once | INTRAVENOUS | Status: AC
  Administered 2017-10-14: 23:00:00 via INTRAVENOUS

## 2017-10-14 MED ORDER — DIPHENHYDRAMINE HCL 50 MG/ML IJ SOLN
50.0000 mg | Freq: Once | INTRAMUSCULAR | Status: AC
  Administered 2017-10-14: 50 mg via INTRAVENOUS
  Filled 2017-10-14: qty 1

## 2017-10-14 MED ORDER — ACETAMINOPHEN 500 MG PO TABS: 1000.0000 mg | ORAL_TABLET | Freq: Once | ORAL | Status: DC

## 2017-10-14 NOTE — ED Triage Notes (Signed)
Patient c/o headache and N/V X 4-5 days. Patient c/o palpitations and left arm pain X 4-5 days.

## 2017-10-14 NOTE — ED Provider Notes (Signed)
[facility] Emergency Department Provider Note  ____________________________________________   First MD Initiated Contact with Patient 10/14/17 2348     (approximate)  I have reviewed the triage vital signs and the nursing notes.   HISTORY  Chief Complaint Headache and Palpitations   HPI Matthew Bartlett is a 52 y.o. male who comes to the emergency department with gradual onset not maximal onset right frontal headache similar to previous headaches for the past 4 days or so.  Associated with nausea vomiting and photophobia.  No neck pain or stiffness.  He has taken over-the-counter medications with minimal relief.  No fevers or chills.  No double vision or blurred vision.  He denies chest pain or shortness of breath.  He denies abdominal pain.  He does report some intermittent palpitations and "numbness" in his left arm.  Symptoms are nonexertional.  Past Medical History:  Diagnosis Date  . Hypertension   . Kidney stones     Patient Active Problem List   Diagnosis Date Noted  . Bradycardia 10/11/2017  . Intractable headache 11/15/2016  . Epigastric pain 08/29/2015  . Uncontrolled hypertension 08/29/2015  . Vomiting 08/27/2015    Past Surgical History:  Procedure Laterality Date  . FINGER SURGERY Right    4th digit    Prior to Admission medications   Medication Sig Start Date End Date Taking? Authorizing Provider  butalbital-acetaminophen-caffeine (FIORICET, ESGIC) [id number] MG tablet Take 1-2 tablets by mouth every 6 (six) hours as needed for headache. 10/15/17 10/15/18  Merrily Brittle, MD  ondansetron (ZOFRAN) 4 MG tablet Take 1 tablet (4 mg total) by mouth every 8 (eight) hours as needed for nausea. 10/12/17   Ihor Austin, MD    Allergies Penicillins  Family History  Problem Relation Age of Onset  . Hypertension Father     Social History Social History   Tobacco Use  . Smoking status: Never Smoker  . Smokeless tobacco: Never Used    Substance Use Topics  . Alcohol use: No  . Drug use: Yes    Types: Marijuana    Comment: last smoked 2 days ago    Review of Systems Constitutional: No fever/chills Eyes: No visual changes. ENT: No sore throat. Cardiovascular: Denies chest pain. Respiratory: Denies shortness of breath. Gastrointestinal: No abdominal pain.  Positive for nausea, positive for vomiting.  No diarrhea.  No constipation. Genitourinary: Negative for dysuria. Musculoskeletal: Negative for back pain. Skin: Negative for rash. Neurological: Positive for headache   ____________________________________________   PHYSICAL EXAM:  VITAL SIGNS: ED Triage Vitals [10/14/17 2125]  Enc Vitals Group     BP (!) 186/109     Pulse Rate 72     Resp 18     Temp 99.7 F (37.6 C)     Temp Source Oral     SpO2 97 %     Weight 225 lb (102.1 kg)     Height      Head Circumference      Peak Flow      Pain Score 10     Pain Loc      Pain Edu?      Excl. in GC?     Constitutional: Alert and oriented x4 appears quite tired curled on his side in a darkened room Eyes: PERRL EOMI. midrange and brisk Head: Atraumatic. Nose: No congestion/rhinnorhea. Mouth/Throat: No trismus Neck: No stridor.  No meningismus Cardiovascular: Normal rate, regular rhythm. Grossly normal heart sounds.  Good peripheral circulation. Respiratory: Normal respiratory effort.  No retractions. Lungs CTAB and moving good air Gastrointestinal: Soft nontender Musculoskeletal: No lower extremity edema   Neurologic:  Normal speech and language. No gross focal neurologic deficits are appreciated. Skin:  Skin is warm, dry and intact. No rash noted. Psychiatric: Appears quite tired    ____________________________________________   DIFFERENTIAL includes but not limited to  Migraine headache, tension headache, intracerebral hemorrhage, subarachnoid hemorrhage, cervical artery dissection, glaucoma ____________________________________________    LABS (all labs ordered are listed, but only abnormal results are displayed)  Labs Reviewed  BASIC METABOLIC PANEL - Abnormal; Notable for the following components:      Result Value   Potassium 3.3 (*)    Chloride 100 (*)    Glucose, Bld 118 (*)    All other components within normal limits  CBC - Abnormal; Notable for the following components:   WBC 17.1 (*)    All other components within normal limits  LIPASE, BLOOD - Abnormal; Notable for the following components:   Lipase 79 (*)    All other components within normal limits  TROPONIN I    Lab work reviewed by me with elevated white count which is nonspecific __________________________________________  EKG  ED ECG REPORT I, Merrily Brittle, the attending physician, personally viewed and interpreted this ECG.  Date: 10/14/2017 EKG Time:  Rate: 64 Rhythm: normal sinus rhythm QRS Axis: normal Intervals: normal ST/T Wave abnormalities: normal Narrative Interpretation: no evidence of acute ischemia  ____________________________________________  RADIOLOGY  Chest x-ray reviewed by me with mild bibasilar atelectasis ____________________________________________   PROCEDURES  Procedure(s) performed: no  Procedures  Critical Care performed: no  Observation: no ____________________________________________   INITIAL IMPRESSION / ASSESSMENT AND PLAN / ED COURSE  Pertinent labs & imaging results that were available during my care of the patient were reviewed by me and considered in my medical decision making (see chart for details).  By the time I saw the patient he had already received Benadryl and Reglan likely explaining why he is tired.  His headache is significantly improved although not resolved.  Given Fioricet with improvement in his symptoms.  He is neuro intact.  No indication for advanced imaging at this time.  I will refer him back to primary care with a short course of Fioricet for home.  He verbalizes  understanding agreement plan with strict return precautions.      ____________________________________________   FINAL CLINICAL IMPRESSION(S) / ED DIAGNOSES  Final diagnoses:  Bad headache      NEW MEDICATIONS STARTED DURING THIS VISIT:  Discharge Medication List as of 10/15/2017 12:18 AM    START taking these medications   Details  butalbital-acetaminophen-caffeine (FIORICET, ESGIC) 50-325-40 MG tablet Take 1-2 tablets by mouth every 6 (six) hours as needed for headache., Starting Fri 10/15/2017, Until Sat 10/15/2018, Print         Note:  This document was prepared using Dragon voice recognition software and may include unintentional dictation errors.     Merrily Brittle, MD 10/15/17 2244

## 2017-10-15 ENCOUNTER — Other Ambulatory Visit: Payer: Self-pay

## 2017-10-15 MED ORDER — DIPHENHYDRAMINE HCL 50 MG/ML IJ SOLN: 25.0000 mg | Freq: Once | INTRAMUSCULAR | Status: DC

## 2017-10-15 MED ORDER — BUTALBITAL-APAP-CAFFEINE 50-325-40 MG PO TABS: 1.0000 | ORAL_TABLET | Freq: Four times a day (QID) | ORAL | 0 refills | Status: AC | PRN

## 2017-10-15 MED ORDER — KETOROLAC TROMETHAMINE 30 MG/ML IJ SOLN: 15.0000 mg | Freq: Once | INTRAMUSCULAR | Status: DC

## 2017-10-15 MED ORDER — PROCHLORPERAZINE EDISYLATE 10 MG/2ML IJ SOLN: 10.0000 mg | Freq: Once | INTRAMUSCULAR | Status: DC

## 2017-10-15 MED ORDER — BUTALBITAL-APAP-CAFFEINE 50-325-40 MG PO TABS
1.0000 | ORAL_TABLET | Freq: Once | ORAL | Status: AC
  Administered 2017-10-15: 1 via ORAL
  Filled 2017-10-15: qty 1

## 2017-10-15 NOTE — Discharge Instructions (Addendum)
It was a pleasure to take care of you today, and thank you for coming to our emergency department.  If you have any questions or concerns before leaving please ask the nurse to grab me and I'm more than happy to go through your aftercare instructions again.  If you were prescribed any opioid pain medication today such as Norco, Vicodin, Percocet, morphine, hydrocodone, or oxycodone please make sure you do not drive when you are taking this medication as it can alter your ability to drive safely.  If you have any concerns once you are home that you are not improving or are in fact getting worse before you can make it to your follow-up appointment, please do not hesitate to call [phone] and come back for further evaluation.  Merrily Brittle, MD  Results for orders placed or performed during the hospital encounter of 10/14/17  Basic metabolic panel  Result Value Ref Range   Sodium 136 135 - 145 mmol/L   Potassium 3.3 (L) 3.5 - 5.1 mmol/L   Chloride 100 (L) 101 - 111 mmol/L   CO2 27 22 - 32 mmol/L   Glucose, Bld 118 (H) 65 - 99 mg/dL   BUN 18 6 - 20 mg/dL   Creatinine, Ser 1.61 0.61 - 1.24 mg/dL   Calcium 9.3 8.9 - 09.6 mg/dL   GFR calc non Af Amer >60 >60 mL/min   GFR calc Af Amer >60 >60 mL/min   Anion gap 9 5 - 15  CBC  Result Value Ref Range   WBC 17.1 (H) 3.8 - 10.6 K/uL   RBC 5.32 4.40 - 5.90 MIL/uL   Hemoglobin 16.8 13.0 - 18.0 g/dL   HCT 04.5 40.9 - 81.1 %   MCV 91.0 80.0 - 100.0 fL   MCH 31.6 26.0 - 34.0 pg   MCHC 34.7 32.0 - 36.0 g/dL   RDW 91.4 78.2 - 95.6 %   Platelets 294 150 - 440 K/uL  Troponin I  Result Value Ref Range   Troponin I <0.03 <0.03 ng/mL  Lipase, blood  Result Value Ref Range   Lipase 79 (H) 11 - 51 U/L   Dg Chest 2 View  Result Date: 10/14/2017 CLINICAL DATA:  Palpitations. EXAM: CHEST - 2 VIEW COMPARISON:  AP view 3 days prior 10/11/2017 FINDINGS: The cardiomediastinal contours are normal. Mild bibasilar atelectasis. Pulmonary vasculature is normal. No  consolidation, pleural effusion, or pneumothorax. No acute osseous abnormalities are seen. IMPRESSION: Mild bibasilar atelectasis. Electronically Signed   By: Rubye Oaks M.D.   On: 10/14/2017 22:02   Ct Head Wo Contrast  Result Date: 10/11/2017 CLINICAL DATA:  Nausea and vomiting 4-5 times since this morning. Headache. Hypertension. EXAM: CT HEAD WITHOUT CONTRAST TECHNIQUE: Contiguous axial images were obtained from the base of the skull through the vertex without intravenous contrast. COMPARISON:  11/15/2016 head CT FINDINGS: BRAIN: The ventricles and sulci are normal. No intraparenchymal hemorrhage, mass effect nor midline shift. No acute large vascular territory infarcts. Grey-white matter distinction is maintained. The basal ganglia are unremarkable. No abnormal extra-axial fluid collections. Basal cisterns are not effaced and midline. The brainstem and cerebellar hemispheres are without acute abnormalities. VASCULAR: Unremarkable. SKULL/SOFT TISSUES: No skull fracture. No significant soft tissue swelling. ORBITS/SINUSES: The included ocular globes and orbital contents are normal.The mastoid air cells are clear. The included paranasal sinuses are well-aerated. OTHER: None. IMPRESSION: Normal head CT. Electronically Signed   By: Tollie Eth M.D.   On: 10/11/2017 16:13   Dg Chest Portable 1  View  Result Date: 10/11/2017 CLINICAL DATA:  52 year old male with hypertension, bradycardia, headache and vomiting EXAM: PORTABLE CHEST 1 VIEW COMPARISON:  Prior chest x-ray 08/27/2015 FINDINGS: The lungs are clear and negative for focal airspace consolidation, pulmonary edema or suspicious pulmonary nodule. No pleural effusion or pneumothorax. Cardiac and mediastinal contours are within normal limits. No acute fracture or lytic or blastic osseous lesions. The visualized upper abdominal bowel gas pattern is unremarkable. IMPRESSION: Negative chest x-ray. Electronically Signed   By: Malachy Moan M.D.   On:  10/11/2017 15:23

## 2017-10-15 NOTE — ED Notes (Signed)
Pt reports improvement in nausea. Pt asking for broth. Advised we have crackers. Pt given crackers and water per request.

## 2017-10-15 NOTE — ED Notes (Signed)
Waiting for wife to arrive to d/c pt.

## 2020-02-26 IMAGING — CT CT HEAD W/O CM
4 series · 16 of 47 positions shown, 18 images · non-contrast
Comparison: 11/15/2016 head CT

CLINICAL DATA: Nausea and vomiting 4-5 times since this morning.
Headache. Hypertension.

EXAM:
CT HEAD WITHOUT CONTRAST
TECHNIQUE: Contiguous axial images were obtained from the base of the skull
through the vertex without intravenous contrast.

[Series 2: head wo · axial · 0.47mm/px · z∈[-165,-45]mm · 7 of 32 slices shown, 9 images]
[im 4/32  brain]
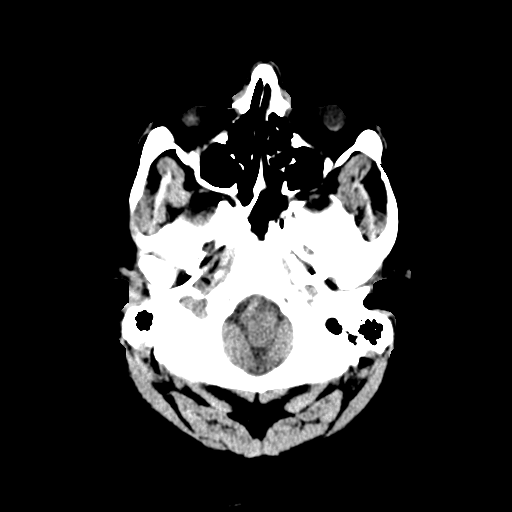
[im 4/32  bone]
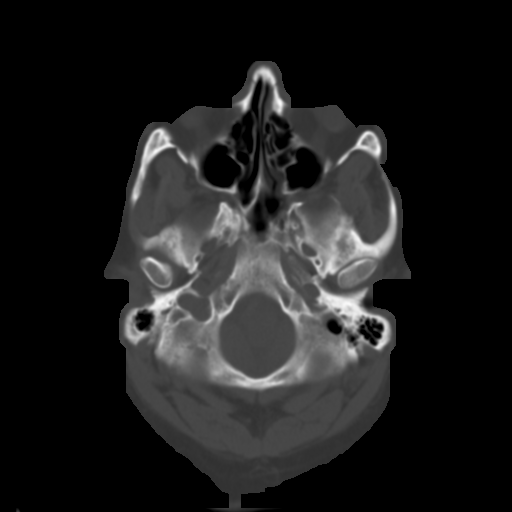
[im 8/32  brain]
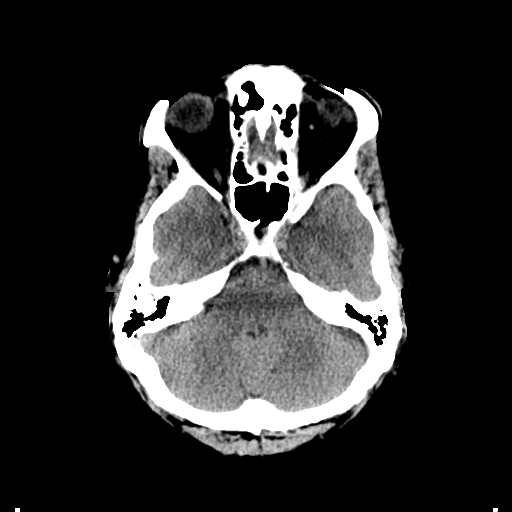
[im 12/32  brain]
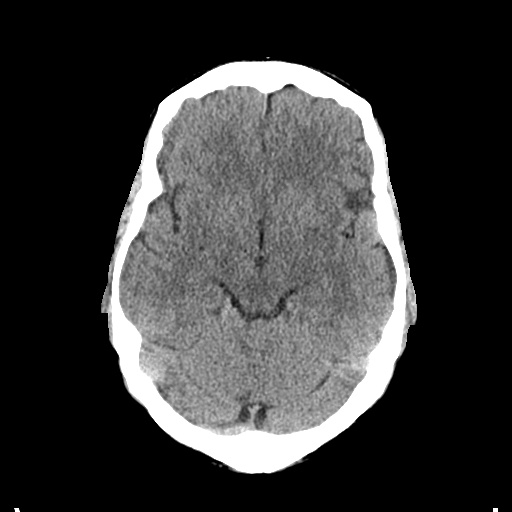
[im 16/32  brain]
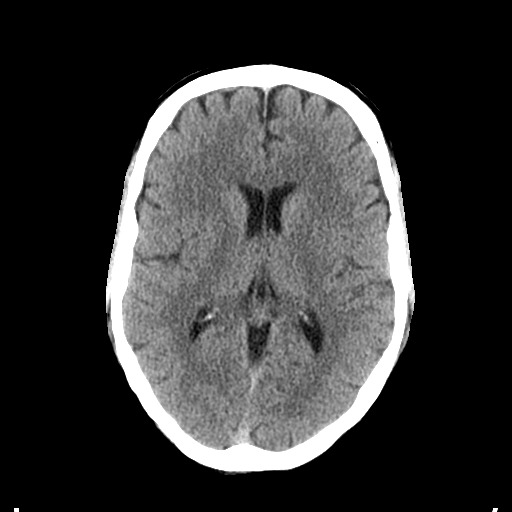
[im 20/32  brain]
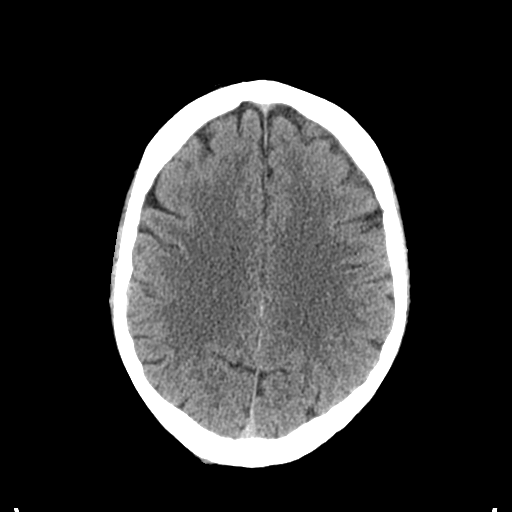
[im 20/32  bone]
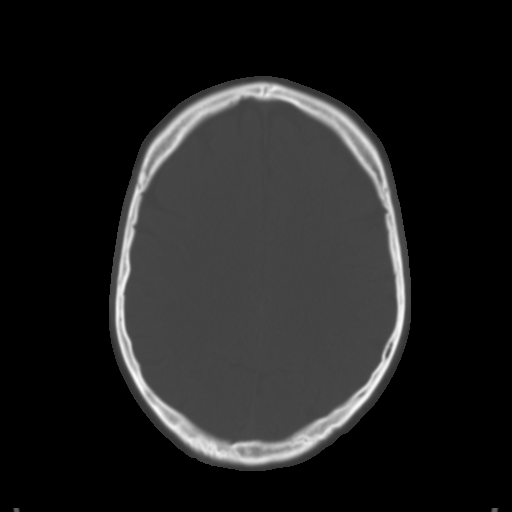
[im 24/32  brain]
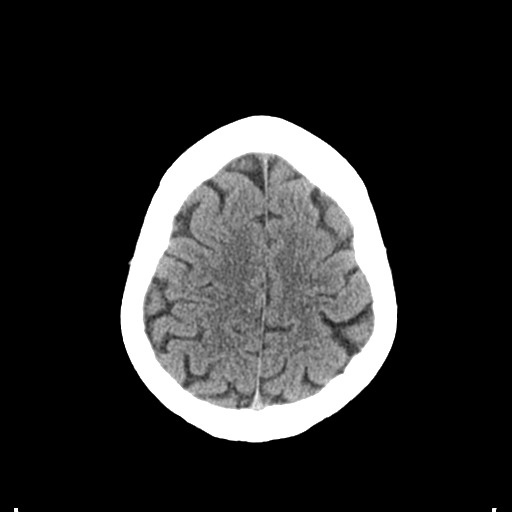
[im 28/32  brain]
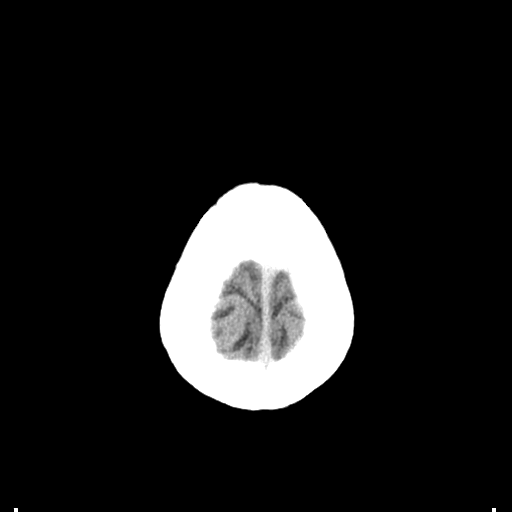

[Series 3: head bone · axial · 0.47mm/px · z∈[-166,-134]mm · 3 of 79 slices shown]
[im 8/79  bone]
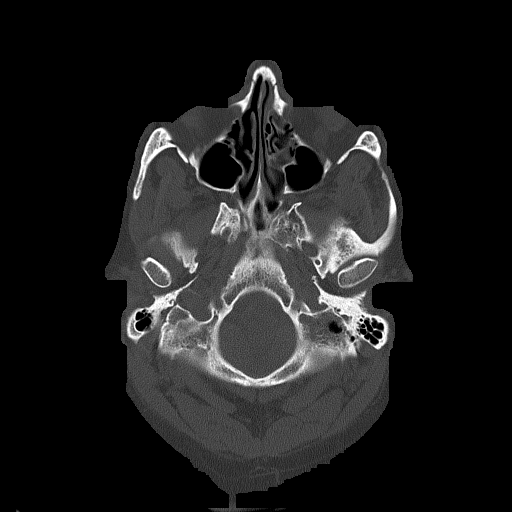
[im 16/79  bone]
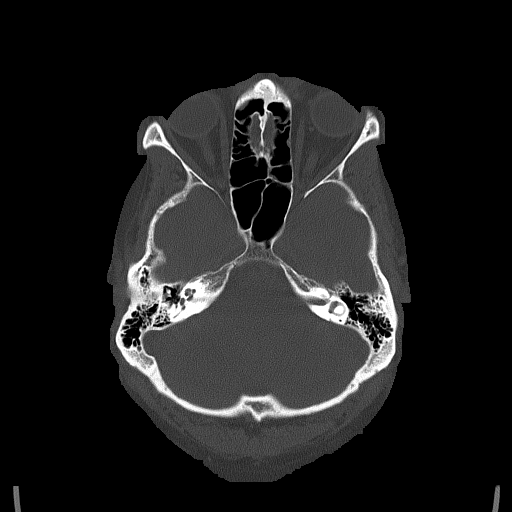
[im 24/79  bone]
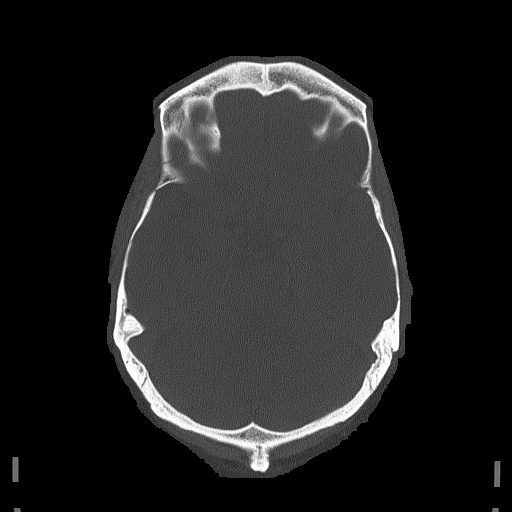

[Series 4: coronal soft tissue · coronal · 0.30mm/px · 3 of 70 slices shown]
[im 24/70  brain]
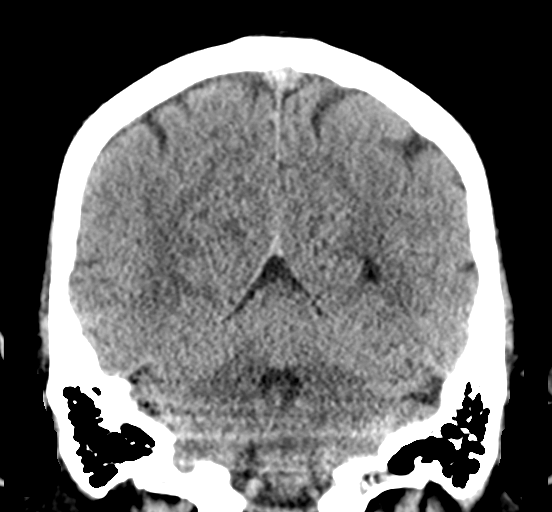
[im 31/70  brain]
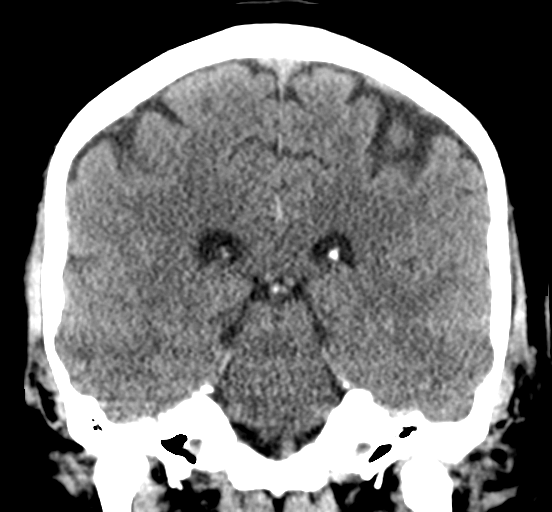
[im 39/70  brain]
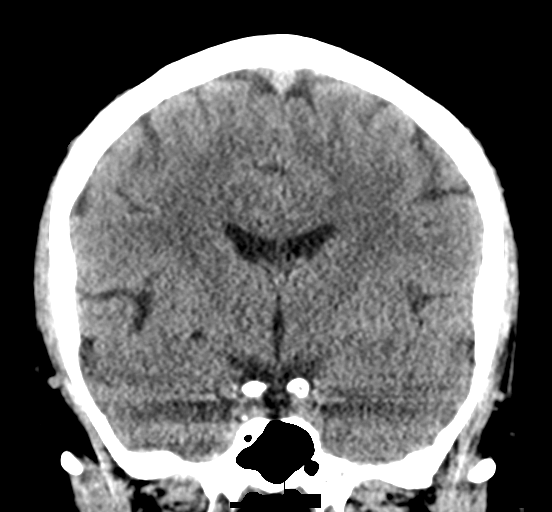

[Series 5: sagittal soft tissue · sagittal · 0.30mm/px · 3 of 55 slices shown]
[im 19/55  brain]
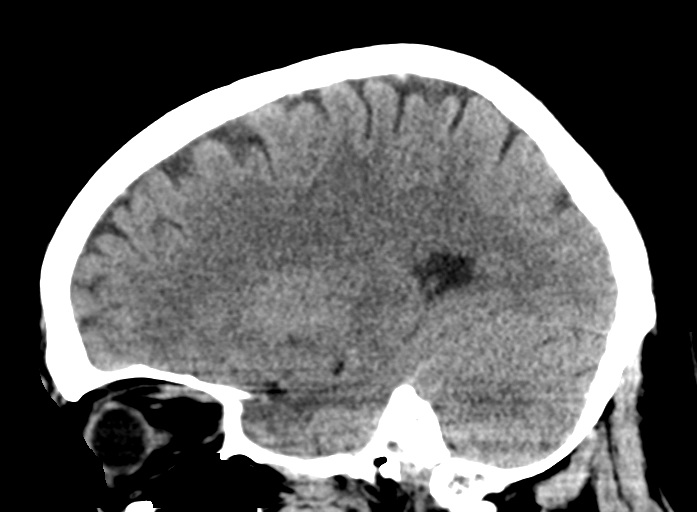
[im 28/55  brain]
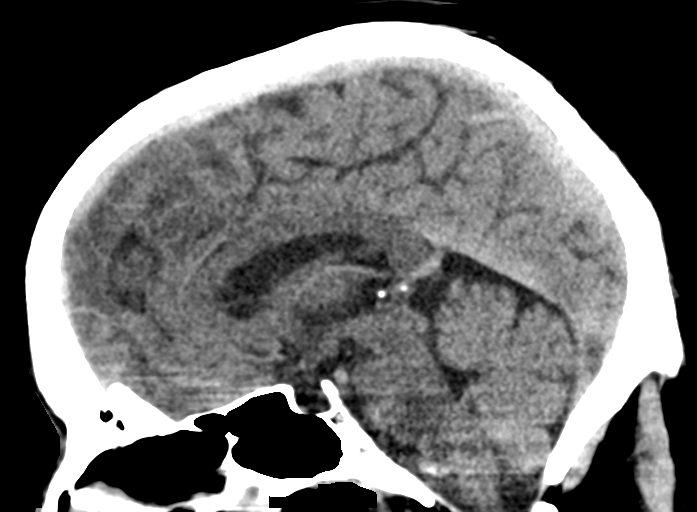
[im 37/55  brain]
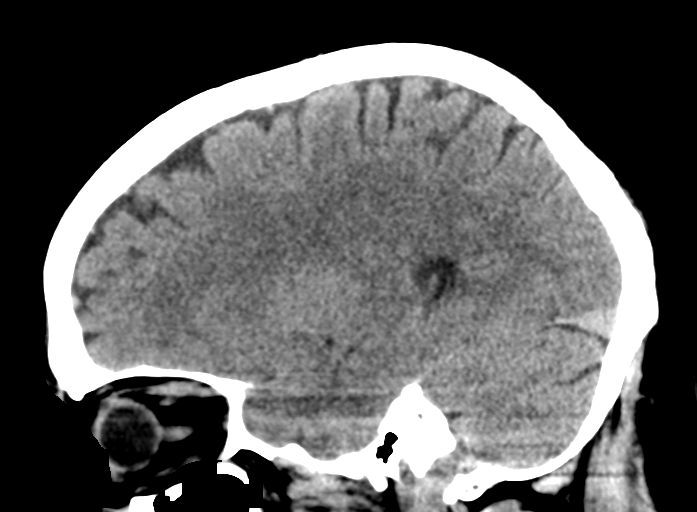

[16 of 47 positions shown; findings below may reference images not displayed]

FINDINGS: BRAIN: The ventricles and sulci are normal. No intraparenchymal
hemorrhage, mass effect nor midline shift. No acute large vascular
territory infarcts. Grey-white matter distinction is maintained. The
basal ganglia are unremarkable. No abnormal extra-axial fluid
collections. Basal cisterns are not effaced and midline. The
brainstem and cerebellar hemispheres are without acute
abnormalities.

VASCULAR: Unremarkable.

SKULL/SOFT TISSUES: No skull fracture. No significant soft tissue
swelling.

ORBITS/SINUSES: The included ocular globes and orbital contents are
normal.The mastoid air cells are clear. The included paranasal
sinuses are well-aerated.

OTHER: None.
IMPRESSION: Normal head CT.

## 2020-02-26 IMAGING — DX DG CHEST 1V PORT
1 series · 1 of 1 positions shown · non-contrast
Comparison: Prior chest x-ray 08/27/2015

CLINICAL DATA: 51-year-old male with hypertension, bradycardia,
headache and vomiting

EXAM:
PORTABLE CHEST 1 VIEW

[chest ap]
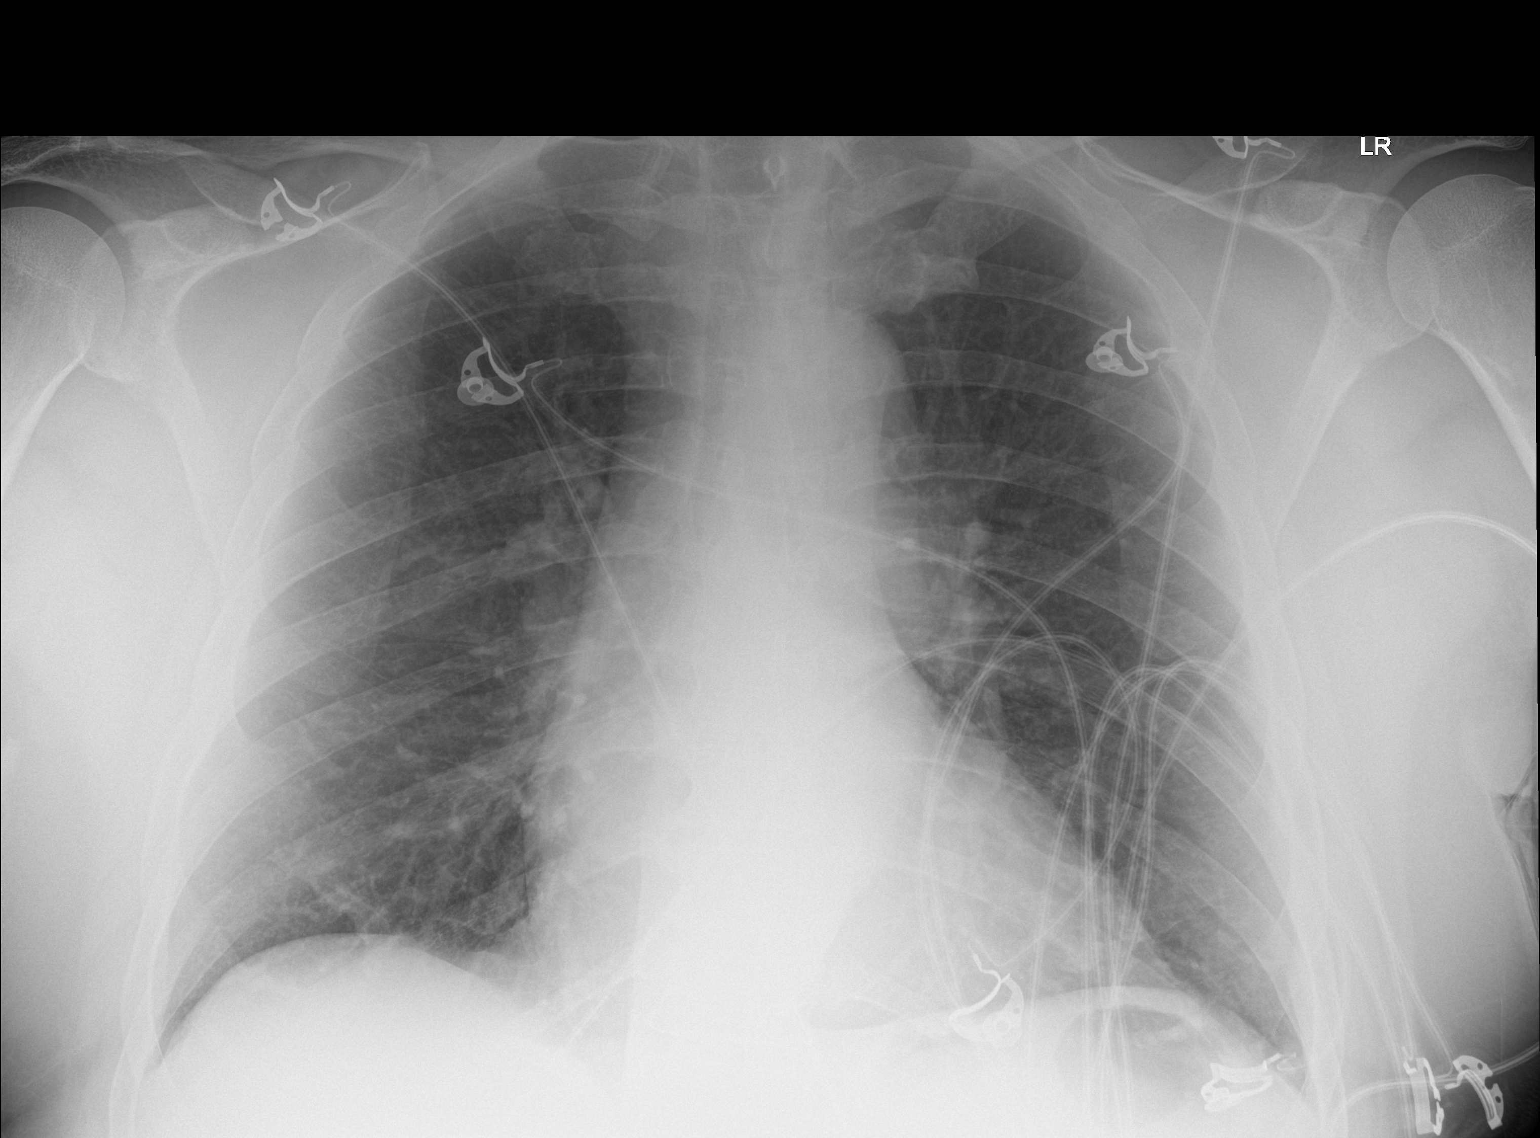

[1 of 1 positions shown; findings below may reference images not displayed]

FINDINGS: The lungs are clear and negative for focal airspace consolidation,
pulmonary edema or suspicious pulmonary nodule. No pleural effusion
or pneumothorax. Cardiac and mediastinal contours are within normal
limits. No acute fracture or lytic or blastic osseous lesions. The
visualized upper abdominal bowel gas pattern is unremarkable.
IMPRESSION: Negative chest x-ray.

## 2022-12-14 ENCOUNTER — Other Ambulatory Visit: Payer: Self-pay

## 2022-12-14 ENCOUNTER — Emergency Department
Admission: EM | Admit: 2022-12-14 | Discharge: 2022-12-14 | Disposition: A | Discharge status: Home / Self Care | Payer: Self-pay | Attending: Emergency Medicine | Admitting: Emergency Medicine

## 2022-12-14 ENCOUNTER — Emergency Department: Payer: Self-pay

## 2022-12-14 DIAGNOSIS — K5792 Diverticulitis of intestine, part unspecified, without perforation or abscess without bleeding: Secondary | ICD-10-CM | POA: Insufficient documentation

## 2022-12-14 DIAGNOSIS — Z87442 Personal history of urinary calculi: Secondary | ICD-10-CM | POA: Insufficient documentation

## 2022-12-14 LAB — CBC WITH DIFFERENTIAL/PLATELET
Abs Immature Granulocytes: 0.07 10*3/uL (ref 0.00–0.07)
Basophils Absolute: 0 10*3/uL (ref 0.0–0.1)
Basophils Relative: 0 %
Eosinophils Absolute: 0 10*3/uL (ref 0.0–0.5)
Eosinophils Relative: 0 %
HCT: 50.8 % (ref 39.0–52.0)
Hemoglobin: 17.6 g/dL — ABNORMAL HIGH (ref 13.0–17.0)
Immature Granulocytes: 1 %
Lymphocytes Relative: 7 %
Lymphs Abs: 1 10*3/uL (ref 0.7–4.0)
MCH: 30.3 pg (ref 26.0–34.0)
MCHC: 34.6 g/dL (ref 30.0–36.0)
MCV: 87.4 fL (ref 80.0–100.0)
Monocytes Absolute: 0.3 10*3/uL (ref 0.1–1.0)
Monocytes Relative: 2 %
Neutro Abs: 13.8 10*3/uL — ABNORMAL HIGH (ref 1.7–7.7)
Neutrophils Relative %: 90 %
Platelets: 335 10*3/uL (ref 150–400)
RBC: 5.81 MIL/uL (ref 4.22–5.81)
RDW: 13.2 % (ref 11.5–15.5)
WBC: 15.2 10*3/uL — ABNORMAL HIGH (ref 4.0–10.5)
nRBC: 0 % (ref 0.0–0.2)

## 2022-12-14 LAB — BASIC METABOLIC PANEL
Anion gap: 12 (ref 5–15)
BUN: 11 mg/dL (ref 6–20)
CO2: 23 mmol/L (ref 22–32)
Calcium: 9.1 mg/dL (ref 8.9–10.3)
Chloride: 102 mmol/L (ref 98–111)
Creatinine, Ser: 0.84 mg/dL (ref 0.61–1.24)
GFR, Estimated: >60 mL/min (ref >60)
Glucose, Bld: 163 mg/dL — ABNORMAL HIGH (ref 70–99)
Potassium: 3.8 mmol/L (ref 3.5–5.1)
Sodium: 137 mmol/L (ref 135–145)

## 2022-12-14 MED ORDER — ONDANSETRON 4 MG PO TBDP: 4.0000 mg | ORAL_TABLET | Freq: Three times a day (TID) | ORAL | 0 refills | Status: AC | PRN

## 2022-12-14 MED ORDER — KETOROLAC TROMETHAMINE 15 MG/ML IJ SOLN
INTRAMUSCULAR | Status: AC
  Filled 2022-12-14: qty 1

## 2022-12-14 MED ORDER — CIPROFLOXACIN IN D5W 200 MG/100ML IV SOLN
200.0000 mg | Freq: Once | INTRAVENOUS | Status: AC
  Administered 2022-12-14: 200 mg via INTRAVENOUS
  Filled 2022-12-14: qty 100

## 2022-12-14 MED ORDER — METRONIDAZOLE 500 MG PO TABS: 500.0000 mg | ORAL_TABLET | Freq: Two times a day (BID) | ORAL | 0 refills | Status: AC

## 2022-12-14 MED ORDER — MORPHINE SULFATE (PF) 4 MG/ML IV SOLN
4.0000 mg | Freq: Once | INTRAVENOUS | Status: AC
  Administered 2022-12-14: 4 mg via INTRAVENOUS
  Filled 2022-12-14: qty 1

## 2022-12-14 MED ORDER — SODIUM CHLORIDE 0.9 % IV SOLN
25.0000 mg | Freq: Four times a day (QID) | INTRAVENOUS | Status: DC | PRN
  Administered 2022-12-14: 25 mg via INTRAVENOUS
  Filled 2022-12-14 (×2): qty 1

## 2022-12-14 MED ORDER — METRONIDAZOLE 500 MG/100ML IV SOLN
500.0000 mg | Freq: Once | INTRAVENOUS | Status: AC
  Administered 2022-12-14: 500 mg via INTRAVENOUS
  Filled 2022-12-14: qty 100

## 2022-12-14 MED ORDER — KETOROLAC TROMETHAMINE 30 MG/ML IJ SOLN
15.0000 mg | Freq: Once | INTRAMUSCULAR | Status: AC
  Administered 2022-12-14: 15 mg via INTRAVENOUS

## 2022-12-14 MED ORDER — DICYCLOMINE HCL 10 MG PO CAPS: 10.0000 mg | ORAL_CAPSULE | Freq: Three times a day (TID) | ORAL | 0 refills | Status: AC

## 2022-12-14 MED ORDER — ONDANSETRON 4 MG PO TBDP: 4.0000 mg | ORAL_TABLET | Freq: Three times a day (TID) | ORAL | 0 refills | Status: DC | PRN

## 2022-12-14 MED ORDER — SODIUM CHLORIDE 0.9 % IV BOLUS
1000.0000 mL | Freq: Once | INTRAVENOUS | Status: AC
  Administered 2022-12-14: 1000 mL via INTRAVENOUS

## 2022-12-14 MED ORDER — DOCUSATE SODIUM 100 MG PO CAPS: 100.0000 mg | ORAL_CAPSULE | Freq: Two times a day (BID) | ORAL | 0 refills | Status: AC

## 2022-12-14 MED ORDER — HYDROCODONE-ACETAMINOPHEN 5-325 MG PO TABS: 1.0000 | ORAL_TABLET | ORAL | 0 refills | Status: AC | PRN

## 2022-12-14 MED ORDER — CIPROFLOXACIN HCL 500 MG PO TABS: 500.0000 mg | ORAL_TABLET | Freq: Two times a day (BID) | ORAL | 0 refills | Status: AC

## 2022-12-14 NOTE — ED Provider Notes (Signed)
[facility] Provider Note  Patient Contact: 7:11 PM (approximate)   History   Flank Pain   HPI  Matthew Bartlett is a 57 y.o. male who presents emergency complaining of left-sided abdominal pain.  Patient states that the pain is sharp, has been present since this morning.  Nausea, vomiting, no diarrhea or constipation.  Patient denies any dysuria, polyuria or hematuria.  States that he has a history of kidney stones and this feels similar though it does feel "somewhat different.  He states it feels more in his "belly" than a typical kidney stone.  There has been no trauma to the back or abdomen.  Patient denies fevers, chills.  No URI symptoms.  Patient denies any sick contacts.     Physical Exam   Triage Vital Signs: ED Triage Vitals  Encounter Vitals Group     BP 12/14/22 1652 (!) 242/92     Systolic BP Percentile --      Diastolic BP Percentile --      Pulse Rate 12/14/22 1652 (!) 54     Resp 12/14/22 1652 18     Temp 12/14/22 1652 98.9 F (37.2 C)     Temp Source 12/14/22 1652 Oral     SpO2 12/14/22 1652 98 %     Weight 12/14/22 1651 240 lb (108.9 kg)     Height 12/14/22 1651 5\' 6"  (1.676 m)     Head Circumference --      Peak Flow --      Pain Score 12/14/22 1651 10     Pain Loc --      Pain Education --      Exclude from Growth Chart --     Most recent vital signs: Vitals:   12/14/22 1944 12/14/22 2119  BP:  (!) 189/89  Pulse: (!) 52 61  Resp: 19 16  Temp: 100 F (37.8 C) 99.8 F (37.7 C)  SpO2: 96% 97%     General: Alert and in no acute distress.   Cardiovascular:  Good peripheral perfusion Respiratory: Normal respiratory effort without tachypnea or retractions. Lungs CTAB. Good air entry to the bases with no decreased or absent breath sounds. Gastrointestinal: Bowel sounds 4 quadrants.  Soft to palpation.  Very tender in the left lower quadrant with some mild guarding.. No rigidity. No palpable masses. No distention. No CVA  tenderness. Musculoskeletal: Full range of motion to all extremities.  Neurologic:  No gross focal neurologic deficits are appreciated.  Skin:   No rash noted Other:   ED Results / Procedures / Treatments   Labs (all labs ordered are listed, but only abnormal results are displayed) Labs Reviewed  BASIC METABOLIC PANEL - Abnormal; Notable for the following components:      Result Value   Glucose, Bld 163 (*)    All other components within normal limits  CBC WITH DIFFERENTIAL/PLATELET - Abnormal; Notable for the following components:   WBC 15.2 (*)    Hemoglobin 17.6 (*)    Neutro Abs 13.8 (*)    All other components within normal limits  URINALYSIS, ROUTINE W REFLEX MICROSCOPIC     EKG     RADIOLOGY  I personally viewed, evaluated, and interpreted these images as part of my medical decision making, as well as reviewing the written report by the radiologist.  ED Provider Interpretation: Findings consistent with diverticulitis without evidence of perforation.  Patient has punctate stones in the left kidney without any intra ureter stones.  No  perinephric stranding.  CT Renal Stone Study  Result Date: 12/14/2022 CLINICAL DATA:  Left-sided flank pain EXAM: CT ABDOMEN AND PELVIS WITHOUT CONTRAST TECHNIQUE: Multidetector CT imaging of the abdomen and pelvis was performed following the standard protocol without IV contrast. RADIATION DOSE REDUCTION: This exam was performed according to the departmental dose-optimization program which includes automated exposure control, adjustment of the mA and/or kV according to patient size and/or use of iterative reconstruction technique. COMPARISON:  CT 11/17/2016 FINDINGS: Lower chest: No acute abnormality. Hepatobiliary: Hepatic steatosis. No calcified gallstone or biliary dilatation Pancreas: Unremarkable. No pancreatic ductal dilatation or surrounding inflammatory changes. Spleen: Normal in size without focal abnormality. Adrenals/Urinary Tract:  Adrenal glands are normal. Kidneys show no hydronephrosis. Punctate stone upper pole left kidney. It the bladder is unremarkable. Stomach/Bowel: Stomach is within normal limits. Appendix appears normal. No evidence of bowel wall thickening, distention, or inflammatory changes. 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 No aneurysm. No suspicious lymph nodes. Reproductive: Prostate is unremarkable. Other: Negative for pelvic effusion or free air. Small fat containing inguinal hernias. Musculoskeletal: No acute or suspicious osseous abnormality IMPRESSION: 1. Negative for hydronephrosis or ureteral stone. Punctate nonobstructing stone upper pole left kidney. 2. Hepatic steatosis. 3. 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 4. Aortic atherosclerosis. Aortic Atherosclerosis (ICD10-I70.0). Electronically Signed   By: Jasmine Pang M.D.   On: 12/14/2022 17:39    PROCEDURES:  Critical Care performed: No  Procedures   MEDICATIONS ORDERED IN ED: Medications  ketorolac (TORADOL) 15 MG/ML injection (has no administration in time range)  promethazine (PHENERGAN) 25 mg in sodium chloride 0.9 % 50 mL IVPB (0 mg Intravenous Stopped 12/14/22 2050)  ketorolac (TORADOL) 30 MG/ML injection 15 mg (15 mg Intravenous Given 12/14/22 1657)  ciprofloxacin (CIPRO) IVPB 200 mg (0 mg Intravenous Stopped 12/14/22 2121)  metroNIDAZOLE (FLAGYL) IVPB 500 mg (0 mg Intravenous Stopped 12/14/22 2023)  morphine (PF) 4 MG/ML injection 4 mg (4 mg Intravenous Given 12/14/22 1917)  sodium chloride 0.9 % bolus 1,000 mL (0 mLs Intravenous Stopped 12/14/22 2121)     IMPRESSION / MDM / ASSESSMENT AND PLAN / ED COURSE  I reviewed the triage vital signs and the nursing notes.                                 Differential diagnosis includes, but is not limited to, nephrolithiasis, pyelonephritis, UTI, diverticulitis, ulcerative  colitis   Patient's presentation is most consistent with acute presentation with potential threat to life or bodily function.   Patient's diagnosis is consistent with diverticulitis.  Patient presents emergency department left sided abdominal pain.  He said some nausea, vomiting.  History of nephrolithiasis and patient states that pain felt similar but was slightly more in his abdomen than his flank.  Patient was tender in his abdomen and labs, imaging was obtained.  Patient has findings consistent with diverticulitis without perforation or abscess.  Antibiotics here, symptom control medications.  Return precautions discussed with the patient.  Otherwise follow-up with primary care... Patient is given ED precautions to return to the ED for any worsening or new symptoms.     FINAL CLINICAL IMPRESSION(S) / ED DIAGNOSES   Final diagnoses:  Diverticulitis     Rx / DC Orders   ED Discharge Orders          Ordered    ciprofloxacin (CIPRO) 500 MG tablet  2 times daily        12/14/22 2040    metroNIDAZOLE (FLAGYL) 500 MG tablet  2 times daily        12/14/22 2040    dicyclomine (BENTYL) 10 MG capsule  3 times daily before meals & bedtime        12/14/22 2040    docusate sodium (COLACE) 100 MG capsule  2 times daily        12/14/22 2040    ondansetron (ZOFRAN-ODT) 4 MG disintegrating tablet  Every 8 hours PRN,   Status:  Discontinued        12/14/22 2040    HYDROcodone-acetaminophen (NORCO/VICODIN) 5-325 MG tablet  Every 4 hours PRN        12/14/22 2040    ondansetron (ZOFRAN-ODT) 4 MG disintegrating tablet  Every 8 hours PRN        12/14/22 2103             Note:  This document was prepared using Dragon voice recognition software and may include unintentional dictation errors.   Lanette Hampshire 12/14/22 2318    Jene Every, MD 12/17/22 [phone]

## 2022-12-14 NOTE — ED Triage Notes (Signed)
Ems report: pt with LLQ abd pain (10/10) similar to past kidney stones. Ems gave zofran 4 mg iv. VS 230/110, 85, 98% RA, cbg 181.

## 2022-12-14 NOTE — ED Notes (Signed)
See triage notes. Patient c/o left side flank pain that started today. Patient has a hx of kidney stones.

## 2022-12-14 NOTE — ED Triage Notes (Signed)
Pt to ED via EMS from home, pt reports left side flank pain that started earlier today. Pt has hx kidney stones and states this feels the dame. Pt denies difficulty urinating or blood in urine.
